# Patient Record
Sex: Male | Born: 1935
Health system: Southern US, Community
[De-identification: ages and names within clinical notes are randomized; demographics above are authoritative.]

## PROBLEM LIST (undated history)

## (undated) DIAGNOSIS — K219 Gastro-esophageal reflux disease without esophagitis: Secondary | ICD-10-CM

## (undated) DIAGNOSIS — I4891 Unspecified atrial fibrillation: Secondary | ICD-10-CM

## (undated) DIAGNOSIS — N400 Enlarged prostate without lower urinary tract symptoms: Secondary | ICD-10-CM

## (undated) DIAGNOSIS — I10 Essential (primary) hypertension: Secondary | ICD-10-CM

## (undated) DIAGNOSIS — I2510 Atherosclerotic heart disease of native coronary artery without angina pectoris: Secondary | ICD-10-CM

## (undated) DIAGNOSIS — E039 Hypothyroidism, unspecified: Secondary | ICD-10-CM

## (undated) DIAGNOSIS — R300 Dysuria: Secondary | ICD-10-CM

## (undated) DIAGNOSIS — M1990 Unspecified osteoarthritis, unspecified site: Secondary | ICD-10-CM

## (undated) DIAGNOSIS — E785 Hyperlipidemia, unspecified: Secondary | ICD-10-CM

## (undated) HISTORY — DX: Essential (primary) hypertension: I10

## (undated) HISTORY — PX: THYROID SURGERY: SHX805

## (undated) HISTORY — DX: Dysuria: R30.0

## (undated) HISTORY — PX: OTHER SURGICAL HISTORY: SHX169

## (undated) HISTORY — PX: TRANSURETHRAL RESECTION OF PROSTATE: SHX73

## (undated) HISTORY — DX: Unspecified atrial fibrillation: I48.91

## (undated) HISTORY — DX: Hyperlipidemia, unspecified: E78.5

## (undated) HISTORY — DX: Hypothyroidism, unspecified: E03.9

## (undated) HISTORY — DX: Atherosclerotic heart disease of native coronary artery without angina pectoris: I25.10

## (undated) HISTORY — DX: Benign prostatic hyperplasia without lower urinary tract symptoms: N40.0

---

## 2005-12-23 ENCOUNTER — Ambulatory Visit: Payer: Self-pay | Admitting: Unknown Physician Specialty

## 2005-12-30 ENCOUNTER — Ambulatory Visit: Payer: Self-pay | Admitting: Unknown Physician Specialty

## 2009-01-15 ENCOUNTER — Ambulatory Visit: Payer: Self-pay | Admitting: Ophthalmology

## 2009-01-23 ENCOUNTER — Ambulatory Visit: Payer: Self-pay | Admitting: Ophthalmology

## 2009-04-09 ENCOUNTER — Ambulatory Visit: Payer: Self-pay | Admitting: Ophthalmology

## 2013-03-22 ENCOUNTER — Emergency Department: Payer: Self-pay | Admitting: Internal Medicine

## 2013-03-22 LAB — CBC
HCT: 40.3 % (ref 40.0–52.0)
MCH: 32.5 pg (ref 26.0–34.0)
MCHC: 34.8 g/dL (ref 32.0–36.0)
Platelet: 198 10*3/uL (ref 150–440)
RBC: 4.32 10*6/uL — ABNORMAL LOW (ref 4.40–5.90)
WBC: 6.8 10*3/uL (ref 3.8–10.6)

## 2013-03-22 LAB — TROPONIN I: Troponin-I: <0.02 ng/mL

## 2013-03-22 LAB — COMPREHENSIVE METABOLIC PANEL
Albumin: 3.9 g/dL (ref 3.4–5.0)
Alkaline Phosphatase: 65 U/L (ref 50–136)
Anion Gap: 5 — ABNORMAL LOW (ref 7–16)
Calcium, Total: 9 mg/dL (ref 8.5–10.1)
Chloride: 106 mmol/L (ref 98–107)
Glucose: 111 mg/dL — ABNORMAL HIGH (ref 65–99)
Potassium: 3.8 mmol/L (ref 3.5–5.1)
SGOT(AST): 22 U/L (ref 15–37)
Sodium: 139 mmol/L (ref 136–145)
Total Protein: 6.9 g/dL (ref 6.4–8.2)

## 2013-03-22 LAB — URINALYSIS, COMPLETE
Bacteria: NONE SEEN
Glucose,UR: NEGATIVE mg/dL (ref 0–75)
Ketone: NEGATIVE
Nitrite: NEGATIVE
Ph: 7 (ref 4.5–8.0)
Protein: NEGATIVE
RBC,UR: 1 /HPF (ref 0–5)
Specific Gravity: 1.009 (ref 1.003–1.030)
Squamous Epithelial: NONE SEEN
WBC UR: <1 /HPF (ref 0–5)

## 2013-03-23 LAB — URINE CULTURE

## 2013-03-27 LAB — CULTURE, BLOOD (SINGLE)

## 2015-01-10 ENCOUNTER — Ambulatory Visit: Payer: Medicare Other

## 2015-01-10 ENCOUNTER — Other Ambulatory Visit: Payer: Self-pay

## 2015-01-11 ENCOUNTER — Encounter: Payer: Self-pay | Admitting: Urology

## 2015-01-11 ENCOUNTER — Ambulatory Visit (INDEPENDENT_AMBULATORY_CARE_PROVIDER_SITE_OTHER): Payer: Medicare Other | Admitting: Urology

## 2015-01-11 VITALS — BP 160/84 | HR 92 | Ht 69.0 in | Wt 143.9 lb

## 2015-01-11 DIAGNOSIS — N138 Other obstructive and reflux uropathy: Secondary | ICD-10-CM

## 2015-01-11 DIAGNOSIS — N401 Enlarged prostate with lower urinary tract symptoms: Secondary | ICD-10-CM

## 2015-01-11 DIAGNOSIS — R300 Dysuria: Secondary | ICD-10-CM

## 2015-01-11 LAB — BLADDER SCAN AMB NON-IMAGING: SCAN RESULT: 178

## 2015-01-11 MED ORDER — TAMSULOSIN HCL 0.4 MG PO CAPS: 0.4000 mg | ORAL_CAPSULE | Freq: Every day | ORAL | Status: DC

## 2015-01-11 MED ORDER — SILODOSIN 8 MG PO CAPS: 8.0000 mg | ORAL_CAPSULE | Freq: Every day | ORAL | Status: DC

## 2015-01-11 NOTE — Progress Notes (Signed)
01/11/2015 2:34 PM   Vincent Cash Sr. 1922/08/14 [id number]  Referring provider: No referring provider defined for this encounter.  Chief Complaint  Patient presents with  . Urinary Tract Infection    HPI: Mr. Fadeley is a 81 year old white male with a history of dysuria and BPH with LUTS managed with finasteride.  His IPSS score today is 12, which is moderate lower urinary tract symptomatology. He is mixed with his quality life due to his urinary symptoms. His PVR is 178 mL.    His major complaint today dysuria and frequency.  He has had these symptoms for two  years.  He denies any dysuria, hematuria or suprapubic pain.   He currently taking finasteride.  His has had a cystoscopy with Dr. Edwyna Shell on 05/23/2013 which noted trabeculation of the bladder.  Previous PSA's:    2.4 ng/mL on 01/24/2014    He also denies any recent fevers, chills, nausea or vomiting.      IPSS      01/11/15 1400       International Prostate Symptom Score   How often have you had the sensation of not emptying your bladder? Less than 1 in 5     How often have you had to urinate less than every two hours? More than half the time     How often have you found you stopped and started again several times when you urinated? Less than half the time     How often have you found it difficult to postpone urination? Less than 1 in 5 times     How often have you had a weak urinary stream? Not at All     How often have you had to strain to start urination? Less than 1 in 5 times     How many times did you typically get up at night to urinate? 3 Times     Total IPSS Score 12     Quality of Life due to urinary symptoms   If you were to spend the rest of your life with your urinary condition just the way it is now how would you feel about that? Mixed        Score:  1-7 Mild 8-19 Moderate 20-35 Severe     PMH: No past medical history on file.  Surgical History: No past surgical history on  file.  Home Medications:    Medication List       This list is accurate as of: 01/11/15  2:34 PM.  Always use your most recent med list.               aspirin EC 81 MG tablet  Take by mouth.     carvedilol 6.25 MG tablet  Commonly known as:  COREG  Take by mouth.     finasteride 5 MG tablet  Commonly known as:  PROSCAR  Take by mouth.     isosorbide mononitrate 60 MG 24 hr tablet  Commonly known as:  IMDUR  Take by mouth.     levothyroxine 150 MCG tablet  Commonly known as:  SYNTHROID, LEVOTHROID  Take by mouth.     naproxen 250 MG tablet  Commonly known as:  NAPROSYN  Take by mouth.     NIFEdipine 30 MG 24 hr tablet  Commonly known as:  PROCARDIA-XL/ADALAT-CC/NIFEDICAL-XL  Take by mouth.     omeprazole 20 MG capsule  Commonly known as:  PRILOSEC  Take by mouth.  polyethylene glycol packet  Commonly known as:  MIRALAX / GLYCOLAX  Take 17 g by mouth daily.     simvastatin 80 MG tablet  Commonly known as:  ZOCOR  Take by mouth.        Allergies: No Known Allergies  Family History: No family history on file.  Social History:  reports that he has quit smoking. He does not have any smokeless tobacco history on file. He reports that he does not drink alcohol or use illicit drugs.  ROS: UROLOGY Frequent Urination?: Yes Hard to postpone urination?: Yes Burning/pain with urination?: Yes Get up at night to urinate?: Yes Leakage of urine?: No Urine stream starts and stops?: No Trouble starting stream?: No Do you have to strain to urinate?: No Blood in urine?: No Urinary tract infection?: Yes Sexually transmitted disease?: No Injury to kidneys or bladder?: No Painful intercourse?: No Weak stream?: Yes Erection problems?: No Penile pain?: No  Gastrointestinal Nausea?: No Vomiting?: No Indigestion/heartburn?: No Diarrhea?: No Constipation?: Yes  Constitutional Fever: No Night sweats?: No Weight loss?: Yes Fatigue?: Yes  Skin Skin  rash/lesions?: No Itching?: No  Eyes Blurred vision?: No Double vision?: No  Ears/Nose/Throat Sore throat?: No Sinus problems?: No  Hematologic/Lymphatic Swollen glands?: No Easy bruising?: Yes  Cardiovascular Leg swelling?: No Chest pain?: No  Respiratory Cough?: No Shortness of breath?: No  Endocrine Excessive thirst?: No  Musculoskeletal Back pain?: Yes Joint pain?: Yes  Neurological Headaches?: No Dizziness?: Yes  Psychologic Depression?: Yes Anxiety?: Yes  Physical Exam: BP 160/84 mmHg  Pulse 92  Ht 5\' 9"  (1.753 m)  Wt 143 lb 14.4 oz (65.273 kg)  BMI 21.24 kg/m2  GU: Patient with normal phallus. Urethral meatus is patent.  No penile discharge. No penile lesions or rashes. Scrotum without lesions, cysts, rashes and/or edema.  Testicles are located scrotally bilaterally. No masses are appreciated in the testicles. Left and right epididymis are normal.  Rectal: Patient with  normal sphincter tone. Perineum without scarring or rashes. No rectal masses are appreciated. Prostate is approximately 60 grams, no nodules are appreciated. Seminal vesicles are normal.   Laboratory Data: Results for orders placed or performed in visit on 01/11/15  CULTURE, URINE COMPREHENSIVE  Result Value Ref Range   Result 1 Comment   Microscopic Examination  Result Value Ref Range   WBC, UA None seen 0 -  5 /hpf   RBC, UA None seen 0 -  2 /hpf   Epithelial Cells (non renal) None seen 0 - 10 /hpf   Renal Epithel, UA None seen None seen /hpf   Bacteria, UA None seen None seen/Few  Urinalysis, Complete  Result Value Ref Range   Specific Gravity, UA 1.010 1.005 - 1.030   pH, UA 7.0 5.0 - 7.5   Color, UA Yellow Yellow   Appearance Ur Clear Clear   Leukocytes, UA Negative Negative   Protein, UA Negative Negative/Trace   Glucose, UA Negative Negative   Ketones, UA Negative Negative   RBC, UA Trace (A) Negative   Bilirubin, UA Negative Negative   Urobilinogen, Ur 0.2 0.2 -  1.0 mg/dL   Nitrite, UA Negative Negative   Microscopic Examination See below:   BLADDER SCAN AMB NON-IMAGING  Result Value Ref Range   Scan Result 178    Lab Results  Component Value Date   WBC 6.8 03/22/2013   HGB 14.0 03/22/2013   HCT 40.3 03/22/2013   MCV 93 03/22/2013   PLT 198 03/22/2013    Lab  Results  Component Value Date   CREATININE 1.42* 03/22/2013    No results found for: PSA  No results found for: TESTOSTERONE  No results found for: HGBA1C  Urinalysis    Component Value Date/Time   COLORURINE Straw 03/22/2013 0717   APPEARANCEUR Clear 03/22/2013 0717   LABSPEC 1.009 03/22/2013 0717   PHURINE 7.0 03/22/2013 0717   GLUCOSEU Negative 03/22/2013 0717   HGBUR Negative 03/22/2013 0717   BILIRUBINUR Negative 03/22/2013 0717   KETONESUR Negative 03/22/2013 0717   PROTEINUR Negative 03/22/2013 0717   NITRITE Negative 03/22/2013 0717   LEUKOCYTESUR Negative 03/22/2013 0717    Pertinent Imaging:   Assessment & Plan:    1. BPH with LUTS:   Patient's IPSS score is 12/3.  His PVR 178 mL.  His DRE demonstrates enlargement.   I will restart his tamsulosin and he will continue the finasteride.  He is advised to take tamsulosin  30 minutes after his evening meal.  I advised him of the side effects, such as: retrograde ejaculation, sinus congestion, nasal congestion, rhinorrhea, rhinitis, dizziness, and seasonal allergic rhinitis.  He will follow up in two weeks for a  PVR and an IPSS.    - BLADDER SCAN AMB NON-IMAGING - Urinalysis, Complete  2. Dysuria:   UA is unremarkable today.  I will send his urine for culture for completeness.  If dysuria continues and UCx is negative, we should send his urine for cytology.     No Follow-up on file.  Michiel Cowboy, PA-C  [facility] [address], [address] [place], [place] [address] [phone]

## 2015-01-12 LAB — URINALYSIS, COMPLETE
Bilirubin, UA: NEGATIVE
Glucose, UA: NEGATIVE
KETONES UA: NEGATIVE
Leukocytes, UA: NEGATIVE
NITRITE UA: NEGATIVE
PROTEIN UA: NEGATIVE
SPEC GRAV UA: 1.01 (ref 1.005–1.030)
Urobilinogen, Ur: 0.2 mg/dL (ref 0.2–1.0)
pH, UA: 7 (ref 5.0–7.5)

## 2015-01-12 LAB — MICROSCOPIC EXAMINATION
BACTERIA UA: NONE SEEN
EPITHELIAL CELLS (NON RENAL): NONE SEEN /HPF (ref 0–10)
RBC, UA: NONE SEEN /hpf (ref 0–?)
Renal Epithel, UA: NONE SEEN /hpf
WBC, UA: NONE SEEN /hpf (ref 0–?)

## 2015-01-13 DIAGNOSIS — R300 Dysuria: Secondary | ICD-10-CM | POA: Insufficient documentation

## 2015-01-13 DIAGNOSIS — N138 Other obstructive and reflux uropathy: Secondary | ICD-10-CM | POA: Insufficient documentation

## 2015-01-13 DIAGNOSIS — N401 Enlarged prostate with lower urinary tract symptoms: Secondary | ICD-10-CM

## 2015-01-14 LAB — CULTURE, URINE COMPREHENSIVE

## 2015-01-29 ENCOUNTER — Ambulatory Visit: Payer: Medicare Other | Admitting: Urology

## 2015-02-05 ENCOUNTER — Encounter: Payer: Self-pay | Admitting: *Deleted

## 2015-02-13 ENCOUNTER — Ambulatory Visit (INDEPENDENT_AMBULATORY_CARE_PROVIDER_SITE_OTHER): Payer: Medicare Other | Admitting: Urology

## 2015-02-13 ENCOUNTER — Encounter: Payer: Self-pay | Admitting: Urology

## 2015-02-13 VITALS — BP 142/70 | HR 89 | Resp 18 | Ht 68.0 in | Wt 142.9 lb

## 2015-02-13 DIAGNOSIS — N401 Enlarged prostate with lower urinary tract symptoms: Secondary | ICD-10-CM | POA: Diagnosis not present

## 2015-02-13 DIAGNOSIS — R300 Dysuria: Secondary | ICD-10-CM | POA: Diagnosis not present

## 2015-02-13 DIAGNOSIS — N138 Other obstructive and reflux uropathy: Secondary | ICD-10-CM

## 2015-02-13 LAB — BLADDER SCAN AMB NON-IMAGING

## 2015-02-13 NOTE — Patient Instructions (Signed)
Patient may pick up Cystex PLUS, which is an over counter medication, after he completes the Uribel samples.

## 2015-02-13 NOTE — Progress Notes (Signed)
7:59 PM   Vincent LYON Sr. 11/01/1922 [id number]  Referring provider: Rafael Bihari, MD [address] [place], [place] [address]  Chief Complaint  Patient presents with  . Follow-up    2 week  . Dysuria  . Benign Prostatic Hypertrophy  . Urinary Tract Infection    HPI: Patient is a 81-year-old white male who presents today for 2 week follow-up after restarting his tamsulosin for his symptoms of dysuria and frequency.  Patient's urine was sent for culture and returned negative for infection.  He states his symptoms remain unchanged. He is still experiencing dysuria and frequency.  He is taking the tamsulosin and finasteride as prescribed.  His IPSS score today is 7, which is mild lower urinary tract symptomatology and an improvement from his previous score 14.  He is mixed with his quality life due to his urinary symptoms. His PVR is 20 mL which is also an improvement from 178 mL from his last visit.  His major complaint today are the continuation of the dysuria and frequency.  He has had these symptoms for two  years.  He denies  hematuria or suprapubic pain.     He also denies any recent fevers, chills, nausea or vomiting.      IPSS      01/11/15 1400 02/13/15 1400     International Prostate Symptom Score   How often have you had the sensation of not emptying your bladder? Less than 1 in 5 Not at All    How often have you had to urinate less than every two hours? More than half the time More than half the time    How often have you found you stopped and started again several times when you urinated? Less than half the time Not at All    How often have you found it difficult to postpone urination? Less than 1 in 5 times Not at All    How often have you had a weak urinary stream? Not at All Not at All    How often have you had to strain to start urination? Less than 1 in 5 times Not at All    How many times did you typically get up at night to urinate? 3 Times  3 Times    Total IPSS Score 12 7    Quality of Life due to urinary symptoms   If you were to spend the rest of your life with your urinary condition just the way it is now how would you feel about that? Mixed Mixed       Score:  1-7 Mild 8-19 Moderate 20-35 Severe     PMH: Past Medical History  Diagnosis Date  . Hypothyroid   . HLD (hyperlipidemia)   . HTN (hypertension)   . BPH (benign prostatic hyperplasia)   . Dysuria     Surgical History: Past Surgical History  Procedure Laterality Date  . Transurethral resection of prostate    . Collapsed lung      x 2  . Thyroid surgery      Home Medications:    Medication List       This list is accurate as of: 02/13/15  7:59 PM.  Always use your most recent med list.               aspirin EC 81 MG tablet  Take by mouth.     carvedilol 6.25 MG tablet  Commonly known as:  COREG  Take by mouth.     finasteride 5 MG tablet  Commonly known as:  PROSCAR  Take by mouth.     isosorbide mononitrate 60 MG 24 hr tablet  Commonly known as:  IMDUR  Take by mouth.     levothyroxine 150 MCG tablet  Commonly known as:  SYNTHROID, LEVOTHROID  Take by mouth.     naproxen 250 MG tablet  Commonly known as:  NAPROSYN  Take by mouth.     NIFEdipine 30 MG 24 hr tablet  Commonly known as:  PROCARDIA-XL/ADALAT-CC/NIFEDICAL-XL  Take by mouth.     omeprazole 20 MG capsule  Commonly known as:  PRILOSEC  Take by mouth.     polyethylene glycol packet  Commonly known as:  MIRALAX / GLYCOLAX  Take 17 g by mouth daily.     silodosin 8 MG Caps capsule  Commonly known as:  RAPAFLO  Take 1 capsule (8 mg total) by mouth daily with breakfast.     simvastatin 80 MG tablet  Commonly known as:  ZOCOR  Take by mouth.     tamsulosin 0.4 MG Caps capsule  Commonly known as:  FLOMAX  Take 1 capsule (0.4 mg total) by mouth daily.        Allergies: No Known Allergies  Family History: Family History  Problem Relation Age of  Onset  . Kidney disease Father   . Bladder Cancer Father     Social History:  reports that he has quit smoking. He does not have any smokeless tobacco history on file. He reports that he does not drink alcohol or use illicit drugs.  ROS: UROLOGY Frequent Urination?: No Hard to postpone urination?: No Burning/pain with urination?: Yes Get up at night to urinate?: Yes Leakage of urine?: No Urine stream starts and stops?: No Trouble starting stream?: No Do you have to strain to urinate?: No Blood in urine?: No Urinary tract infection?: No Sexually transmitted disease?: No Injury to kidneys or bladder?: No Painful intercourse?: No Weak stream?: No Erection problems?: No Penile pain?: No  Gastrointestinal Nausea?: No Vomiting?: No Indigestion/heartburn?: No Diarrhea?: No Constipation?: Yes  Constitutional Fever: No Night sweats?: No Weight loss?: No Fatigue?: No  Skin Skin rash/lesions?: No Itching?: No  Eyes Blurred vision?: No Double vision?: No  Ears/Nose/Throat Sore throat?: No Sinus problems?: Yes  Hematologic/Lymphatic Swollen glands?: No Easy bruising?: Yes  Cardiovascular Leg swelling?: No Chest pain?: No  Respiratory Cough?: No Shortness of breath?: No  Endocrine Excessive thirst?: No  Musculoskeletal Back pain?: Yes Joint pain?: No  Neurological Headaches?: No Dizziness?: Yes  Psychologic Depression?: Yes Anxiety?: No  Physical Exam: Blood pressure 142/70, pulse 89, resp. rate 18, height 5\' 8"  (1.727 m), weight 142 lb 14.4 oz (64.819 kg).  Pertinent Imaging: Results for orders placed or performed in visit on 02/13/15  BLADDER SCAN AMB NON-IMAGING  Result Value Ref Range   Scan Result 20ml     Assessment & Plan:    1. BPH with LUTS:   Patient's IPSS score is 7/3.  His PVR 0 mL.  His DRE demonstrates enlargement.   The restarting of the tamsulosin with the finasteride did improve the IPS S score and PVR, but he still is  experiencing dysuria and frequency.  - BLADDER SCAN AMB NON-IMAGING  2. Dysuria:   Patient is still experiencing dysuria and frequency. I will send his urine for cytology. I'll given him samples of Uribel advised him to purchase over-the-counter Cystex plus when he finished the [employer]  samples    Return for pending urine cytology results.  Michiel Cowboy, PA-C  [facility] [address], [address] [place], [place] [address] [phone]

## 2015-03-04 ENCOUNTER — Other Ambulatory Visit: Payer: Self-pay | Admitting: Urology

## 2015-03-05 ENCOUNTER — Telehealth: Payer: Self-pay

## 2015-03-05 NOTE — Telephone Encounter (Signed)
Spoke with pt in reference to cytology results. Pt stated hes not burning as much on urination but hes still have dysuria.

## 2015-03-05 NOTE — Telephone Encounter (Signed)
-----   Message from Shannon A McGowan, PA-C sent at 03/04/2015  8:34 PM EDT ----- Urine cytology was negative for malignant cells.  Is patient still experiencing dysuria? 

## 2015-03-05 NOTE — Telephone Encounter (Signed)
LMOM

## 2015-03-05 NOTE — Telephone Encounter (Signed)
-----   Message from Harle Battiest, PA-C sent at 03/04/2015  8:34 PM EDT ----- Urine cytology was negative for malignant cells.  Is patient still experiencing dysuria?

## 2015-03-18 ENCOUNTER — Emergency Department (HOSPITAL_COMMUNITY): Payer: Medicare Other

## 2015-03-18 ENCOUNTER — Observation Stay (HOSPITAL_COMMUNITY)
Admission: EM | Admit: 2015-03-18 | Discharge: 2015-03-19 | Disposition: A | Discharge status: Home / Self Care | Payer: Medicare Other | Attending: Internal Medicine | Admitting: Internal Medicine

## 2015-03-18 ENCOUNTER — Encounter (HOSPITAL_COMMUNITY): Payer: Self-pay | Admitting: General Practice

## 2015-03-18 ENCOUNTER — Inpatient Hospital Stay (HOSPITAL_COMMUNITY): Payer: Medicare Other

## 2015-03-18 DIAGNOSIS — I4891 Unspecified atrial fibrillation: Secondary | ICD-10-CM

## 2015-03-18 DIAGNOSIS — M1990 Unspecified osteoarthritis, unspecified site: Secondary | ICD-10-CM | POA: Diagnosis not present

## 2015-03-18 DIAGNOSIS — Z79899 Other long term (current) drug therapy: Secondary | ICD-10-CM | POA: Diagnosis not present

## 2015-03-18 DIAGNOSIS — Z23 Encounter for immunization: Secondary | ICD-10-CM | POA: Diagnosis not present

## 2015-03-18 DIAGNOSIS — I2510 Atherosclerotic heart disease of native coronary artery without angina pectoris: Secondary | ICD-10-CM | POA: Diagnosis present

## 2015-03-18 DIAGNOSIS — Z87891 Personal history of nicotine dependence: Secondary | ICD-10-CM | POA: Insufficient documentation

## 2015-03-18 DIAGNOSIS — K5909 Other constipation: Secondary | ICD-10-CM | POA: Diagnosis present

## 2015-03-18 DIAGNOSIS — I10 Essential (primary) hypertension: Secondary | ICD-10-CM | POA: Diagnosis not present

## 2015-03-18 DIAGNOSIS — I480 Paroxysmal atrial fibrillation: Secondary | ICD-10-CM | POA: Diagnosis present

## 2015-03-18 DIAGNOSIS — N400 Enlarged prostate without lower urinary tract symptoms: Secondary | ICD-10-CM | POA: Diagnosis present

## 2015-03-18 DIAGNOSIS — E538 Deficiency of other specified B group vitamins: Secondary | ICD-10-CM | POA: Diagnosis present

## 2015-03-18 DIAGNOSIS — J449 Chronic obstructive pulmonary disease, unspecified: Secondary | ICD-10-CM | POA: Diagnosis present

## 2015-03-18 DIAGNOSIS — N183 Chronic kidney disease, stage 3 unspecified: Secondary | ICD-10-CM | POA: Diagnosis present

## 2015-03-18 DIAGNOSIS — E785 Hyperlipidemia, unspecified: Secondary | ICD-10-CM | POA: Diagnosis present

## 2015-03-18 DIAGNOSIS — E039 Hypothyroidism, unspecified: Secondary | ICD-10-CM | POA: Diagnosis present

## 2015-03-18 DIAGNOSIS — R55 Syncope and collapse: Principal | ICD-10-CM | POA: Insufficient documentation

## 2015-03-18 DIAGNOSIS — N138 Other obstructive and reflux uropathy: Secondary | ICD-10-CM | POA: Diagnosis present

## 2015-03-18 DIAGNOSIS — I482 Chronic atrial fibrillation, unspecified: Secondary | ICD-10-CM | POA: Diagnosis present

## 2015-03-18 DIAGNOSIS — N401 Enlarged prostate with lower urinary tract symptoms: Secondary | ICD-10-CM

## 2015-03-18 DIAGNOSIS — Z7982 Long term (current) use of aspirin: Secondary | ICD-10-CM | POA: Insufficient documentation

## 2015-03-18 DIAGNOSIS — D649 Anemia, unspecified: Secondary | ICD-10-CM | POA: Diagnosis present

## 2015-03-18 DIAGNOSIS — Z87898 Personal history of other specified conditions: Secondary | ICD-10-CM | POA: Diagnosis present

## 2015-03-18 HISTORY — DX: Unspecified osteoarthritis, unspecified site: M19.90

## 2015-03-18 LAB — BASIC METABOLIC PANEL
Anion gap: 7 (ref 5–15)
BUN: 20 mg/dL (ref 6–20)
CHLORIDE: 107 mmol/L (ref 101–111)
CO2: 26 mmol/L (ref 22–32)
Calcium: 9.1 mg/dL (ref 8.9–10.3)
Creatinine, Ser: 1.32 mg/dL — ABNORMAL HIGH (ref 0.61–1.24)
GFR calc Af Amer: 53 mL/min — ABNORMAL LOW (ref >60)
GFR calc non Af Amer: 46 mL/min — ABNORMAL LOW (ref >60)
GLUCOSE: 124 mg/dL — AB (ref 65–99)
POTASSIUM: 4 mmol/L (ref 3.5–5.1)
Sodium: 140 mmol/L (ref 135–145)

## 2015-03-18 LAB — PROTIME-INR
INR: 1.09 (ref 0.00–1.49)
PROTHROMBIN TIME: 14.3 s (ref 11.6–15.2)

## 2015-03-18 LAB — CBC
HEMATOCRIT: 38.6 % — AB (ref 39.0–52.0)
Hemoglobin: 12.6 g/dL — ABNORMAL LOW (ref 13.0–17.0)
MCH: 30 pg (ref 26.0–34.0)
MCHC: 32.6 g/dL (ref 30.0–36.0)
MCV: 91.9 fL (ref 78.0–100.0)
Platelets: 263 10*3/uL (ref 150–400)
RBC: 4.2 MIL/uL — ABNORMAL LOW (ref 4.22–5.81)
RDW: 13.8 % (ref 11.5–15.5)
WBC: 6.8 10*3/uL (ref 4.0–10.5)

## 2015-03-18 LAB — HEPARIN LEVEL (UNFRACTIONATED): HEPARIN UNFRACTIONATED: 0.29 [IU]/mL — AB (ref 0.30–0.70)

## 2015-03-18 LAB — LIPID PANEL
CHOLESTEROL: 122 mg/dL (ref 0–200)
HDL: 45 mg/dL (ref >40)
LDL Cholesterol: 70 mg/dL (ref 0–99)
Total CHOL/HDL Ratio: 2.7 RATIO
Triglycerides: 34 mg/dL (ref <150)
VLDL: 7 mg/dL (ref 0–40)

## 2015-03-18 LAB — BRAIN NATRIURETIC PEPTIDE: B NATRIURETIC PEPTIDE 5: 179.3 pg/mL — AB (ref 0.0–100.0)

## 2015-03-18 LAB — HEPATIC FUNCTION PANEL
ALBUMIN: 3.4 g/dL — AB (ref 3.5–5.0)
ALT: 16 U/L — ABNORMAL LOW (ref 17–63)
AST: 26 U/L (ref 15–41)
Alkaline Phosphatase: 69 U/L (ref 38–126)
BILIRUBIN TOTAL: 0.5 mg/dL (ref 0.3–1.2)
Bilirubin, Direct: <0.1 mg/dL — ABNORMAL LOW (ref 0.1–0.5)
TOTAL PROTEIN: 7 g/dL (ref 6.5–8.1)

## 2015-03-18 LAB — IRON AND TIBC
IRON: 56 ug/dL (ref 45–182)
Saturation Ratios: 17 % — ABNORMAL LOW (ref 17.9–39.5)
TIBC: 326 ug/dL (ref 250–450)
UIBC: 270 ug/dL

## 2015-03-18 LAB — TSH: TSH: 1.758 u[IU]/mL (ref 0.350–4.500)

## 2015-03-18 LAB — URINALYSIS, ROUTINE W REFLEX MICROSCOPIC
BILIRUBIN URINE: NEGATIVE
Glucose, UA: NEGATIVE mg/dL
Hgb urine dipstick: NEGATIVE
Ketones, ur: 15 mg/dL — AB
Leukocytes, UA: NEGATIVE
NITRITE: NEGATIVE
Protein, ur: NEGATIVE mg/dL
SPECIFIC GRAVITY, URINE: 1.01 (ref 1.005–1.030)
UROBILINOGEN UA: 0.2 mg/dL (ref 0.0–1.0)
pH: 7 (ref 5.0–8.0)

## 2015-03-18 LAB — RETICULOCYTES
RBC.: 4.11 MIL/uL — ABNORMAL LOW (ref 4.22–5.81)
RETIC COUNT ABSOLUTE: 41.1 10*3/uL (ref 19.0–186.0)
Retic Ct Pct: 1 % (ref 0.4–3.1)

## 2015-03-18 LAB — FOLATE: FOLATE: 26.7 ng/mL (ref >5.9)

## 2015-03-18 LAB — TROPONIN I
Troponin I: <0.03 ng/mL (ref <0.031)
Troponin I: <0.03 ng/mL (ref <0.031)

## 2015-03-18 LAB — MAGNESIUM: MAGNESIUM: 2.2 mg/dL (ref 1.7–2.4)

## 2015-03-18 LAB — I-STAT TROPONIN, ED: Troponin i, poc: 0 ng/mL (ref 0.00–0.08)

## 2015-03-18 LAB — FERRITIN: Ferritin: 85 ng/mL (ref 24–336)

## 2015-03-18 LAB — VITAMIN B12: Vitamin B-12: 173 pg/mL — ABNORMAL LOW (ref 180–914)

## 2015-03-18 LAB — PHOSPHORUS: PHOSPHORUS: 3 mg/dL (ref 2.5–4.6)

## 2015-03-18 MED ORDER — ACETAMINOPHEN 650 MG RE SUPP: 650.0000 mg | Freq: Four times a day (QID) | RECTAL | Status: DC | PRN

## 2015-03-18 MED ORDER — MELATONIN 5 MG PO CAPS: 5.0000 mg | ORAL_CAPSULE | Freq: Every day | ORAL | Status: DC

## 2015-03-18 MED ORDER — VITAMIN B-12 1000 MCG PO TABS: 1000.0000 ug | ORAL_TABLET | Freq: Every day | ORAL | Status: DC

## 2015-03-18 MED ORDER — CARVEDILOL 6.25 MG PO TABS: 6.2500 mg | ORAL_TABLET | Freq: Two times a day (BID) | ORAL | Status: DC

## 2015-03-18 MED ORDER — ACETAMINOPHEN 325 MG PO TABS: 650.0000 mg | ORAL_TABLET | Freq: Four times a day (QID) | ORAL | Status: DC | PRN

## 2015-03-18 MED ORDER — SODIUM CHLORIDE 0.9 % IJ SOLN
3.0000 mL | Freq: Two times a day (BID) | INTRAMUSCULAR | Status: DC
  Administered 2015-03-18 – 2015-03-19 (×3): 3 mL via INTRAVENOUS

## 2015-03-18 MED ORDER — ONDANSETRON HCL 4 MG PO TABS: 4.0000 mg | ORAL_TABLET | Freq: Four times a day (QID) | ORAL | Status: DC | PRN

## 2015-03-18 MED ORDER — ONDANSETRON HCL 4 MG/2ML IJ SOLN: 4.0000 mg | Freq: Four times a day (QID) | INTRAMUSCULAR | Status: DC | PRN

## 2015-03-18 MED ORDER — LEVOTHYROXINE SODIUM 150 MCG PO TABS
150.0000 ug | ORAL_TABLET | Freq: Every day | ORAL | Status: DC
  Filled 2015-03-18: qty 1

## 2015-03-18 MED ORDER — CYANOCOBALAMIN 1000 MCG/ML IJ SOLN
1000.0000 ug | Freq: Once | INTRAMUSCULAR | Status: DC
  Filled 2015-03-18: qty 1

## 2015-03-18 MED ORDER — SENNOSIDES-DOCUSATE SODIUM 8.6-50 MG PO TABS
1.0000 | ORAL_TABLET | Freq: Every evening | ORAL | Status: DC | PRN
  Filled 2015-03-18: qty 1

## 2015-03-18 MED ORDER — INFLUENZA VAC SPLIT QUAD 0.5 ML IM SUSY
0.5000 mL | PREFILLED_SYRINGE | INTRAMUSCULAR | Status: AC
  Administered 2015-03-19: 0.5 mL via INTRAMUSCULAR
  Filled 2015-03-18: qty 0.5

## 2015-03-18 MED ORDER — HEPARIN (PORCINE) IN NACL 100-0.45 UNIT/ML-% IJ SOLN
950.0000 [IU]/h | INTRAMUSCULAR | Status: DC
  Administered 2015-03-18: 900 [IU]/h via INTRAVENOUS
  Administered 2015-03-19: 950 [IU]/h via INTRAVENOUS
  Filled 2015-03-18 (×2): qty 250

## 2015-03-18 MED ORDER — HEPARIN BOLUS VIA INFUSION
3500.0000 [IU] | Freq: Once | INTRAVENOUS | Status: AC
Start: 2015-03-18 — End: 2015-03-18
  Administered 2015-03-18: 3500 [IU] via INTRAVENOUS
  Filled 2015-03-18: qty 3500

## 2015-03-18 MED ORDER — ENSURE ENLIVE PO LIQD
237.0000 mL | Freq: Two times a day (BID) | ORAL | Status: DC
  Administered 2015-03-18 – 2015-03-19 (×2): 237 mL via ORAL

## 2015-03-18 MED ORDER — PANTOPRAZOLE SODIUM 40 MG PO TBEC
40.0000 mg | DELAYED_RELEASE_TABLET | Freq: Every day | ORAL | Status: DC
  Administered 2015-03-18 – 2015-03-19 (×2): 40 mg via ORAL
  Filled 2015-03-18 (×2): qty 1

## 2015-03-18 MED ORDER — CARVEDILOL 6.25 MG PO TABS
6.2500 mg | ORAL_TABLET | Freq: Two times a day (BID) | ORAL | Status: DC
  Administered 2015-03-18 – 2015-03-19 (×2): 6.25 mg via ORAL
  Filled 2015-03-18 (×2): qty 1

## 2015-03-18 MED ORDER — VITAMIN B-12 1000 MCG PO TABS
1000.0000 ug | ORAL_TABLET | Freq: Every day | ORAL | Status: DC
  Administered 2015-03-18: 1000 ug via ORAL
  Filled 2015-03-18: qty 1

## 2015-03-18 MED ORDER — ATORVASTATIN CALCIUM 40 MG PO TABS
40.0000 mg | ORAL_TABLET | Freq: Every day | ORAL | Status: DC
  Administered 2015-03-18: 40 mg via ORAL
  Filled 2015-03-18: qty 1

## 2015-03-18 MED ORDER — CYANOCOBALAMIN 1000 MCG/ML IJ SOLN: 1000.0000 ug | Freq: Once | INTRAMUSCULAR | Status: DC

## 2015-03-18 MED ORDER — CYANOCOBALAMIN 1000 MCG/ML IJ SOLN
1000.0000 ug | Freq: Once | INTRAMUSCULAR | Status: AC
  Administered 2015-03-19: 1000 ug via INTRAMUSCULAR
  Filled 2015-03-18: qty 1

## 2015-03-18 NOTE — Progress Notes (Signed)
  Echocardiogram 2D Echocardiogram has been performed.  Tye Savoy 03/18/2015, 3:31 PM

## 2015-03-18 NOTE — Progress Notes (Signed)
ANTICOAGULATION CONSULT NOTE - Follow Up Consult  Pharmacy Consult for heparin Indication: atrial fibrillation  No Known Allergies  Patient Measurements: Height:  (175.3 cm) Weight: 142 lb (64.411 kg) IBW/kg (Calculated) : 70.7 Heparin Dosing Weight: 64.4kg  Vital Signs: Temp: 98.4 F (36.9 C) (10/03 1729) Temp Source: Oral (10/03 1729) BP: 123/44 mmHg (10/03 1729) Pulse Rate: 84 (10/03 1729)  Labs:  Recent Labs  03/18/15 0913 03/18/15 1600 03/18/15 1900  HGB 12.6*  --   --   HCT 38.6*  --   --   PLT 263  --   --   LABPROT 14.3  --   --   INR 1.09  --   --   HEPARINUNFRC  --   --  0.29*  CREATININE 1.32*  --   --   TROPONINI  --  <0.03  --     Estimated Creatinine Clearance: 33.9 mL/min (by C-G formula based on Cr of 1.32).   Medical History: Past Medical History  Diagnosis Date  . Hypothyroid   . HLD (hyperlipidemia)   . HTN (hypertension)   . BPH (benign prostatic hyperplasia)   . Dysuria   . Arthritis     Medications:  Infusions:  . heparin 900 Units/hr (03/18/15 1129)    Assessment: 89 yom presented to the ED with syncope. Currently on IV heparin @ 900 units/hr for afib. H/H 12.6/38.6. Plt wnl. RN to check for any s/s of bleeding and call back if any are observed.   Goal of Therapy:  Heparin level 0.3-0.7 units/ml Monitor platelets by anticoagulation protocol: Yes   Plan:  - Increase heparin gtt to 950 units/hr if no bleeding  - Check an 8 hour HL - Daily HL and CBC  Vinnie Level, PharmD., BCPS Clinical Pharmacist Pager [phone]

## 2015-03-18 NOTE — ED Provider Notes (Signed)
CSN: [id number]     Arrival date & time 03/18/15  4098 History   First MD Initiated Contact with Patient 03/18/15 [phone]     Chief Complaint  Patient presents with  . Loss of Consciousness     (Consider location/radiation/quality/duration/timing/severity/associated sxs/prior Treatment) HPI Comments: Patient with history of coronary artery disease (treated medically), hypertension, hyperlipidemia, hypothyroidism -- presents after having a syncopal episode. Patient had a near syncopal episode last night at approximately 10:30 PM after urinating. Patient describes falling slowly to the ground but was awake. He struck his left shoulder on a shower door. He was able to get to his cell phone to call family. Patient was brought to his family's home last night where he could be watched. Upon awaking this morning, patient became very lightheaded after using the bathroom. Patient's family member saw him leaning over and responded to him. Patient was unresponsive for approximately 1 minute. He did not fall or hit his head. EMS was called for transport to the hospital. Patient is now back to his baseline. He denies any chest pain or shortness of breath at any time. He is in A. fib but has no history of the same. No history of congestive heart failure.  The history is provided by the patient and medical records.    Past Medical History  Diagnosis Date  . Hypothyroid   . HLD (hyperlipidemia)   . HTN (hypertension)   . BPH (benign prostatic hyperplasia)   . Dysuria   . Arthritis    Past Surgical History  Procedure Laterality Date  . Transurethral resection of prostate    . Collapsed lung      x 2  . Thyroid surgery     Family History  Problem Relation Age of Onset  . Kidney disease Father   . Bladder Cancer Father    Social History  Substance Use Topics  . Smoking status: Former Games developer  . Smokeless tobacco: None  . Alcohol Use: No    Review of Systems  Constitutional: Negative for fever.   HENT: Negative for rhinorrhea and sore throat.   Eyes: Negative for redness.  Respiratory: Negative for cough and shortness of breath.   Cardiovascular: Negative for chest pain and leg swelling.  Gastrointestinal: Negative for nausea, vomiting, abdominal pain and diarrhea.  Genitourinary: Negative for dysuria.  Musculoskeletal: Negative for myalgias.  Skin: Negative for rash.  Neurological: Positive for syncope and light-headedness. Negative for weakness and headaches.      Allergies  Review of patient's allergies indicates no known allergies.  Home Medications   Prior to Admission medications   Medication Sig Start Date End Date Taking? Authorizing Provider  aspirin EC 81 MG tablet Take by mouth.    Historical Provider, MD  carvedilol (COREG) 6.25 MG tablet Take by mouth. 11/05/14 11/05/15  Historical Provider, MD  finasteride (PROSCAR) 5 MG tablet Take by mouth.    Historical Provider, MD  isosorbide mononitrate (IMDUR) 60 MG 24 hr tablet Take by mouth. 10/15/14   Historical Provider, MD  levothyroxine (SYNTHROID, LEVOTHROID) 150 MCG tablet Take by mouth. 01/07/15   Historical Provider, MD  naproxen (NAPROSYN) 250 MG tablet Take by mouth.    Historical Provider, MD  NIFEdipine (PROCARDIA-XL/ADALAT-CC/NIFEDICAL-XL) 30 MG 24 hr tablet Take by mouth. 12/05/14   Historical Provider, MD  omeprazole (PRILOSEC) 20 MG capsule Take by mouth. 08/20/14   Historical Provider, MD  polyethylene glycol (MIRALAX / GLYCOLAX) packet Take 17 g by mouth daily.    Historical Provider,  MD  silodosin (RAPAFLO) 8 MG CAPS capsule Take 1 capsule (8 mg total) by mouth daily with breakfast. Patient not taking: Reported on 02/13/2015 01/11/15   Carollee Herter A McGowan, PA-C  simvastatin (ZOCOR) 80 MG tablet Take by mouth. 01/22/14   Historical Provider, MD  tamsulosin (FLOMAX) 0.4 MG CAPS capsule Take 1 capsule (0.4 mg total) by mouth daily. 01/11/15   Carollee Herter A McGowan, PA-C   BP 149/90 mmHg  Pulse 123  Temp(Src) 98 F  (36.7 C) (Oral)  Resp 31  Ht  (1.753 m)  Wt 142 lb (64.411 kg)  BMI 20.96 kg/m2  SpO2 100%   Physical Exam  Constitutional: He appears well-developed and well-nourished.  HENT:  Head: Normocephalic and atraumatic.  Mouth/Throat: Oropharynx is clear and moist.  Eyes: Conjunctivae are normal. Right eye exhibits no discharge. Left eye exhibits no discharge.  Neck: Normal range of motion. Neck supple.  Cardiovascular: Normal rate and normal heart sounds.  An irregular rhythm present.  No murmur heard. HR 90  Pulmonary/Chest: Effort normal and breath sounds normal. No respiratory distress. He has no wheezes. He has no rales.  Abdominal: Soft. Bowel sounds are normal. There is no tenderness. There is no rebound and no guarding.  Neurological: He is alert.  Skin: Skin is warm and dry.  Psychiatric: He has a normal mood and affect.  Nursing note and vitals reviewed.   ED Course  Procedures (including critical care time) Labs Review Labs Reviewed  BASIC METABOLIC PANEL - Abnormal; Notable for the following:    Glucose, Bld 124 (*)    Creatinine, Ser 1.32 (*)    GFR calc non Af Amer 46 (*)    GFR calc Af Amer 53 (*)    All other components within normal limits  CBC - Abnormal; Notable for the following:    RBC 4.20 (*)    Hemoglobin 12.6 (*)    HCT 38.6 (*)    All other components within normal limits  URINALYSIS, ROUTINE W REFLEX MICROSCOPIC (NOT AT [facility]) - Abnormal; Notable for the following:    Ketones, ur 15 (*)    All other components within normal limits  PROTIME-INR  I-STAT TROPOININ, ED    Imaging Review Dg Chest 2 View  03/18/2015   CLINICAL DATA:  Syncopal episode last night, resulting in a fall.  EXAM: CHEST  2 VIEW  COMPARISON:  None.  FINDINGS: Normal sized heart. Clear lungs. The lungs are mildly hyperexpanded. Small amount of linear density in the lingula. Thoracic spine degenerative changes. No fracture or pneumothorax seen.  IMPRESSION: 1. Mild  atelectasis or scarring in the lingula. 2. COPD.   Electronically Signed   By: Beckie Salts M.D.   On: 03/18/2015 10:07   I have personally reviewed and evaluated these images and lab results as part of my medical decision-making.   EKG Interpretation   Date/Time:  Monday March 18 2015 08:47:20 EDT Ventricular Rate:  79 PR Interval:    QRS Duration: 73 QT Interval:  354 QTC Calculation: 406 R Axis:   62 Text Interpretation:  Atrial fibrillation Probable anteroseptal infarct,  old Atrial fibrillation Artifact Abnormal ekg Confirmed by Gerhard Munch  MD [phone]) on 03/18/2015 8:58:21 AM      9:13 AM Patient seen and examined. Work-up initiated.    Vital signs reviewed and are as follows: BP 149/90 mmHg  Pulse 123  Temp(Src) 98 F (36.7 C) (Oral)  Resp 31  Ht  (1.753 m)  Wt  142 lb (64.411 kg)  BMI 20.96 kg/m2  SpO2 100%   11:03 AM Patient seen by Dr. Jeraldine Loots. IMTS admit.    MDM   Final diagnoses:  Syncope, unspecified syncope type  Atrial fibrillation, unspecified type (HCC)   Admit.     Renne Crigler, PA-C 03/18/15 1104  Gerhard Munch, MD 03/18/15 [phone]

## 2015-03-18 NOTE — Progress Notes (Signed)
Called ED for report.  RN not available at this time.

## 2015-03-18 NOTE — Consult Note (Signed)
Cardiologist: New Reason for Consult: Syncope Referring Physician:   [name] is an 81 y.o. male.  HPI:   Patient is a very pleasant 81 year old male, whose wife passed away 2 weeks ago, with a history of coronary artery disease, hypothyroidism, hyperlipidemia, hypertension, BPH and arthritis.  Patient had a left heart catheterization 15-20 years ago.  He had a lesion posteriorly that could not be engaged.  He's had no issues with this since and has not seen a cardiologist since that time. This was done at [facility].  He presents with atrial fibrillation and syncopal episode.  Patient reports that approximately 2200hrs last night he got up from his chair went into the bathroom, urinated, suddenly felt dizzy and fell into the shower.  He denies hitting his head.  He had a similar episode at 0600 hrs. this morning when he went to the bathroom, urinated suddenly became dizzy and felt nauseated,sweaty and chilled. His son was holding onto him when he slumped over. He was unconscious for just a few seconds. During the day on Saturday he was extremely active using his push mower to mow the grass and had no difficulties. He denies chest pain or shortness of breath.  The patient currently denies orthopnea, PND, cough, congestion, abdominal pain, hematochezia, melena, lower extremity edema, claudication.   Past Medical History  Diagnosis Date  . Hypothyroid   . HLD (hyperlipidemia)   . HTN (hypertension)   . BPH (benign prostatic hyperplasia)   . Dysuria   . Arthritis     Past Surgical History  Procedure Laterality Date  . Transurethral resection of prostate    . Collapsed lung      x 2  . Thyroid surgery      Family History  Problem Relation Age of Onset  . Kidney disease Father   . Bladder Cancer Father     Social History:  reports that he has quit smoking. He does not have any smokeless tobacco history on file. He reports that he does not drink alcohol or use  illicit drugs.  Allergies: No Known Allergies  Medications:  Medication Sig  aspirin EC 81 MG tablet Take by mouth.  carvedilol (COREG) 6.25 MG tablet Take 6.25 mg by mouth 2 (two) times daily with a meal.   finasteride (PROSCAR) 5 MG tablet Take by mouth.  isosorbide mononitrate (IMDUR) 60 MG 24 hr tablet Take by mouth.  levothyroxine (SYNTHROID, LEVOTHROID) 150 MCG tablet Take by mouth.  Melatonin 5 MG CAPS Take 5 mg by mouth at bedtime.  naproxen (NAPROSYN) 250 MG tablet Take by mouth.  NIFEdipine (PROCARDIA-XL/ADALAT-CC/NIFEDICAL-XL) 30 MG 24 hr tablet Take by mouth.  omeprazole (PRILOSEC) 20 MG capsule Take by mouth.  polyethylene glycol (MIRALAX / GLYCOLAX) packet Take 17 g by mouth daily.  simvastatin (ZOCOR) 80 MG tablet Take by mouth.  tamsulosin (FLOMAX) 0.4 MG CAPS capsule Take 1 capsule (0.4 mg total) by mouth daily.     Results for orders placed or performed during the hospital encounter of 03/18/15 (from the past 48 hour(s))  Basic metabolic panel     Status: Abnormal   Collection Time: 03/18/15  9:13 AM  Result Value Ref Range   Sodium 140 135 - 145 mmol/L   Potassium 4.0 3.5 - 5.1 mmol/L   Chloride 107 101 - 111 mmol/L   CO2 26 22 - 32 mmol/L   Glucose, Bld 124 (H) 65 - 99 mg/dL   BUN 20 6 - 20 mg/dL  Creatinine, Ser 1.32 (H) 0.61 - 1.24 mg/dL   Calcium 9.1 8.9 - 10.3 mg/dL   GFR calc non Af Amer 46 (L) >60 mL/min   GFR calc Af Amer 53 (L) >60 mL/min    Comment: (NOTE) The eGFR has been calculated using the CKD EPI equation. This calculation has not been validated in all clinical situations. eGFR's persistently <60 mL/min signify possible Chronic Kidney Disease.    Anion gap 7 5 - 15  CBC     Status: Abnormal   Collection Time: 03/18/15  9:13 AM  Result Value Ref Range   WBC 6.8 4.0 - 10.5 K/uL   RBC 4.20 (L) 4.22 - 5.81 MIL/uL   Hemoglobin 12.6 (L) 13.0 - 17.0 g/dL   HCT 38.6 (L) 39.0 - 52.0 %   MCV 91.9 78.0 - 100.0 fL   MCH 30.0 26.0 - 34.0 pg    MCHC 32.6 30.0 - 36.0 g/dL   RDW 13.8 11.5 - 15.5 %   Platelets 263 150 - 400 K/uL  Protime-INR     Status: None   Collection Time: 03/18/15  9:13 AM  Result Value Ref Range   Prothrombin Time 14.3 11.6 - 15.2 seconds   INR 1.09 0.00 - 1.49  I-stat troponin, ED     Status: None   Collection Time: 03/18/15  9:37 AM  Result Value Ref Range   Troponin i, poc 0.00 0.00 - 0.08 ng/mL   Comment 3            Comment: Due to the release kinetics of cTnI, a negative result within the first hours of the onset of symptoms does not rule out myocardial infarction with certainty. If myocardial infarction is still suspected, repeat the test at appropriate intervals.   Urinalysis, Routine w reflex microscopic (not at [facility])     Status: Abnormal   Collection Time: 03/18/15  9:45 AM  Result Value Ref Range   Color, Urine YELLOW YELLOW   APPearance CLEAR CLEAR   Specific Gravity, Urine 1.010 1.005 - 1.030   pH 7.0 5.0 - 8.0   Glucose, UA NEGATIVE NEGATIVE mg/dL   Hgb urine dipstick NEGATIVE NEGATIVE   Bilirubin Urine NEGATIVE NEGATIVE   Ketones, ur 15 (A) NEGATIVE mg/dL   Protein, ur NEGATIVE NEGATIVE mg/dL   Urobilinogen, UA 0.2 0.0 - 1.0 mg/dL   Nitrite NEGATIVE NEGATIVE   Leukocytes, UA NEGATIVE NEGATIVE    Comment: MICROSCOPIC NOT DONE ON URINES WITH NEGATIVE PROTEIN, BLOOD, LEUKOCYTES, NITRITE, OR GLUCOSE <1000 mg/dL.    Dg Chest 2 View  03/18/2015   CLINICAL DATA:  Syncopal episode last night, resulting in a fall.  EXAM: CHEST  2 VIEW  COMPARISON:  None.  FINDINGS: Normal sized heart. Clear lungs. The lungs are mildly hyperexpanded. Small amount of linear density in the lingula. Thoracic spine degenerative changes. No fracture or pneumothorax seen.  IMPRESSION: 1. Mild atelectasis or scarring in the lingula. 2. COPD.   Electronically Signed   By: Claudie Revering M.D.   On: 03/18/2015 10:07    Review of Systems  Constitutional: Positive for chills and diaphoresis. Negative for fever.  HENT:  Negative for congestion and sore throat.   Respiratory: Negative for cough and shortness of breath.   Cardiovascular: Negative for chest pain, palpitations, orthopnea, claudication, leg swelling and PND.  Gastrointestinal: Positive for nausea. Negative for vomiting, abdominal pain, blood in stool and melena.  Genitourinary: Negative for hematuria.  Musculoskeletal: Negative for myalgias.  Neurological: Positive for dizziness.  All other systems reviewed and are negative.  Blood pressure 149/65, pulse 43, temperature 98 F (36.7 C), temperature source Oral, resp. rate 13, height _0  (1.753 m), weight 64.411 kg (142 lb), SpO2 99 %. Physical Exam  Nursing note and vitals reviewed. Constitutional: He is oriented to person, place, and time. He appears well-developed and well-nourished. No distress.  HENT:  Head: Normocephalic and atraumatic.  Eyes: EOM are normal. Pupils are equal, round, and reactive to light. No scleral icterus.  Neck: Normal range of motion. Neck supple. No JVD present.  Cardiovascular: Normal rate, S1 normal and S2 normal.  A regularly irregular rhythm present.  No murmur heard. Pulses:      Radial pulses are 2+ on the right side, and 2+ on the left side.       Dorsalis pedis pulses are 2+ on the right side, and 2+ on the left side.  No carotid bruit  Respiratory: Effort normal and breath sounds normal. He has no wheezes. He has no rales.  GI: Soft. Bowel sounds are normal. He exhibits no distension. There is no tenderness.  Musculoskeletal: He exhibits no edema.  Lymphadenopathy:    He has no cervical adenopathy.  Neurological: He is alert and oriented to person, place, and time. He exhibits normal muscle tone.  Skin: Skin is warm and dry.  Psychiatric: He has a normal mood and affect.    Assessment/Plan: Active Problems:   Syncope   Paroxysmal atrial fibrillation (HCC)   HTN   HLD   CAD  While examining the patient, he converted from a controlled rate  A-Fib to normal sinus rhythm with frequent PACs.  He is currently on heparin. CHADSVASC 4.  Given his 2 episodes of syncope I do not think he is a candidate for anticoagulation at this time.  It sounds as though his syncopal events are micturition related.  Continue to monitor cardiac rhythm.  Recommend OP HR monitor.  2-D echocardiogram ordered.  He is extremely active and has no complaints of angina.   Tarri Fuller, [place] 03/18/2015, 12:40 PM   As above, patient seen and examined. Briefly he is a 81 year old male with a past medical history of hypertension, hyperlipidemia, hypothyroidism and possible coronary artery disease for evaluation of syncope. Patient is very active and denies dyspnea on exertion, orthopnea, PND, pedal edema, palpitations or exertional chest pain. He had a cardiac catheterization years ago in [place] and medical therapy recommended by his report. Last night as he was urinating he developed dizziness. He fell against the shower. There was no preceding dyspnea, chest pain, palpitations. He had another episode of dizziness while urinating this morning. He then developed nausea and diaphoresis followed by frank syncope. He was unconscious for several seconds per his daughter-in-law. He now feels well. His initial electrocardiogram showed atrial fibrillation and prior septal infarct could not be excluded. He is now in sinus rhythm.  Etiology of syncope unclear but micturition syncope seems likely. Plan to follow on telemetry. Check echocardiogram for LV function. If normal patient could be discharged tomorrow morning with outpatient event monitor.  Note he was in atrial fibrillation on admission but has converted to sinus rhythm. Check echocardiogram and TSH. Continue coreg. Embolic risk factors include hypertension, coronary artery disease and age greater than 77 CHADSvasc 4. He would benefit from anticoagulation long-term. However given 2 recent syncopal episodes I'm hesitant to  anticoagulate until we make sure there is no recurrence. He understands the high risk of embolic event off of  anticoagulation but I think this is appropriate for now. Note I also explained that he should not drive for 6 months. Vincent Mendez

## 2015-03-18 NOTE — H&P (Signed)
Date: 03/18/2015               Patient Name:  Vincent HAGEDORN Sr. MRN: [id number]  DOB: 1922-07-30 Age / Sex: 81 y.o., male   PCP: Rafael Bihari, MD         Medical Service: Internal Medicine Teaching Service         Attending Physician: Dr. Cliffton Asters     First Contact: Dr. Allena Katz   Pager: [phone]  Second Contact: Dr. Delane Ginger  Pager: [phone]       After Hours (After 5p/  First Contact Pager: [phone]  weekends / holidays): Second Contact Pager: [phone]   Chief Complaint: dizziness   History of Present Illness:   Roque Cash is a very pleasant 81 year old man with past medical history of hypertension, hyperlipidemia, CAD, BPH s/p TURP, CKD Stage 3, and OA who presents with chief complaint of dizziness.   He is accompanied by his son and daughter-in-law.  He reports feeling his normal self until last night when he felt lightheaded after urinating and consequently fell on the toliet after hitting his right shoulder on the shower door. He called his daughter-in-law who then brought him to their house to stay the night. This morning around 6 AM he again felt lightheaded after urinating in the bathroom in addition to feeling diaphoretic, weak, and nauseous. His daughter-in-law then found him slumped over on his knees after which he was unresponsiveness for a few seconds. He denies head trauma, chest pain, dyspnea, orthopnea, PND, LE edema, paraesthesias, focal weakness, slurred speech, confusion, tongue biting, bowel/bladder incontinence, seizure activity, or abdominal pain. He reports having lightheadedness with standing about 2.5 years ago that resolved after adjustment in his medications. He denies history of atrial fibrillation or CHF. He has history of CAD and had heart catherization many years ago (20 yrs)  that was managed with medical therapy. He has history of hypothyroidism which he is compliant with replacement therapy. He has history of BPH for which he is on  finasteride and flomax for. He reports mildly reduced to good PO intake.   He currently reports being back to his baseline. Of note his wife of many years passed away 1 week ago.  Meds: No current facility-administered medications for this encounter.   Current Outpatient Prescriptions  Medication Sig Dispense Refill  . aspirin EC 81 MG tablet Take by mouth.    . carvedilol (COREG) 6.25 MG tablet Take by mouth.    . finasteride (PROSCAR) 5 MG tablet Take by mouth.    . isosorbide mononitrate (IMDUR) 60 MG 24 hr tablet Take by mouth.    . levothyroxine (SYNTHROID, LEVOTHROID) 150 MCG tablet Take by mouth.    . naproxen (NAPROSYN) 250 MG tablet Take by mouth.    Marland Kitchen NIFEdipine (PROCARDIA-XL/ADALAT-CC/NIFEDICAL-XL) 30 MG 24 hr tablet Take by mouth.    Marland Kitchen omeprazole (PRILOSEC) 20 MG capsule Take by mouth.    . polyethylene glycol (MIRALAX / GLYCOLAX) packet Take 17 g by mouth daily.    . silodosin (RAPAFLO) 8 MG CAPS capsule Take 1 capsule (8 mg total) by mouth daily with breakfast. (Patient not taking: Reported on 02/13/2015) 30 capsule 12  . simvastatin (ZOCOR) 80 MG tablet Take by mouth.    . tamsulosin (FLOMAX) 0.4 MG CAPS capsule Take 1 capsule (0.4 mg total) by mouth daily. 30 capsule 12    Allergies: Allergies as of 03/18/2015  . (No Known Allergies)   Past Medical History  Diagnosis Date  . Hypothyroid   . HLD (hyperlipidemia)   . HTN (hypertension)   . BPH (benign prostatic hyperplasia)   . Dysuria   . Arthritis    Past Surgical History  Procedure Laterality Date  . Transurethral resection of prostate    . Collapsed lung      x 2  . Thyroid surgery     Family History  Problem Relation Age of Onset  . Kidney disease Father   . Bladder Cancer Father    Social History   Social History  . Marital Status: Married    Spouse Name: N/A  . Number of Children: N/A  . Years of Education: N/A   Occupational History  . Not on file.   Social History Main Topics  .  Smoking status: Former Games developer  . Smokeless tobacco: Not on file  . Alcohol Use: No  . Drug Use: No  . Sexual Activity: Not on file   Other Topics Concern  . Not on file   Social History Narrative    Review of Systems: Pertinent items are noted in HPI. Plus chronic constipation, chronic nocturia, and right pedal edema  Physical Exam: Blood pressure 149/65, pulse 43, temperature 98 F (36.7 C), temperature source Oral, resp. rate 13, height  (1.753 m), weight 142 lb (64.411 kg), SpO2 99 %.  General: Appears younger than stated ago, NAD, sitting up comfortably in bed HEENT: Normocephalic and atraumatic. Oropharynx is clear and moist  Heart: Irregularly irregular rhythm with normal rate. No murmur. Lungs: Clear to auscultation bilaterally with no ronchi, wheezing, or rales Abdomen: Soft, non-tender, non-distended, normal BS  Extremities: Trace right LE edema Skin: Warm and dry. Senile purpura present.   Neuro: A & O x 3. No focal deficits.     Lab results: Basic Metabolic Panel:  Recent Labs  91/47/82 0913  NA 140  K 4.0  CL 107  CO2 26  GLUCOSE 124*  BUN 20  CREATININE 1.32*  CALCIUM 9.1    CBC:  Recent Labs  03/18/15 0913  WBC 6.8  HGB 12.6*  HCT 38.6*  MCV 91.9  PLT 263   Coagulation:  Recent Labs  03/18/15 0913  LABPROT 14.3  INR 1.09   Urinalysis:  Recent Labs  03/18/15 0945  COLORURINE YELLOW  LABSPEC 1.010  PHURINE 7.0  GLUCOSEU NEGATIVE  HGBUR NEGATIVE  BILIRUBINUR NEGATIVE  KETONESUR 15*  PROTEINUR NEGATIVE  UROBILINOGEN 0.2  NITRITE NEGATIVE  LEUKOCYTESUR NEGATIVE     Imaging results:  Dg Chest 2 View  03/18/2015   CLINICAL DATA:  Syncopal episode last night, resulting in a fall.  EXAM: CHEST  2 VIEW  COMPARISON:  None.  FINDINGS: Normal sized heart. Clear lungs. The lungs are mildly hyperexpanded. Small amount of linear density in the lingula. Thoracic spine degenerative changes. No fracture or pneumothorax seen.   IMPRESSION: 1. Mild atelectasis or scarring in the lingula. 2. COPD.   Electronically Signed   By: Beckie Salts M.D.   On: 03/18/2015 10:07    Other results:  Date/Time: Monday March 18 2015 08:47:20 EDT Ventricular Rate: 79 PR Interval:  QRS Duration: 73 QT Interval: 354 QTC Calculation: 406 R Axis: 62 Text Interpretation: Atrial fibrillation Probable anteroseptal infarct, old Atrial fibrillation     Assessment & Plan by Problem:  Syncope - Pt presents with 2 episodes of syncope after urination most likely situational in nature. Pt reports having prior history about 2 years ago of lightheadedness after urination.  Due to new-onset atrial fibrillation concern for arrhthymia-induced syncope. Also concern for orthostatic hypotension in setting of finasteride and flomax use with other anti-hypertensive medications.  -Cardiology to evaluate patient  -Monitor on telemetry  -Obtain 2D-echo and BNP level  -Obtain orthostatic labs -Hold home flomax 0.4 mg daily, finasteride 5 mg daily, carvedilol 6.25 mg BID, nifedipine 30 mg daily, and imdur 60 mg daily   New-Onset Atrial Fibrillation - Currently in afib with normal rate. Pt with CHADSVASC of 4. Etiology unclear, pt reports no prior history of afib.  -Cardiology to evaluate patient  -Monitor on telemetry  -Start IV heparin per pharmacy  -Hold home aspirin 81 mg daily and carvedilol 6.25 mg BID (resume once HR normalizes) -Obtain 2D-echo and TSH level  -Repeat 12-lead EKG in AM  CAD - Pt with no anginal symptoms. Pt reports having heart catherization approx 20 years ago that was medically managed.  -Hold home aspirin 81 mg daily and carvedilol 6.25 mg BID (resume once HR normalizes) -Hold home nifedipine 30 mg daily, and imdur 60 mg daily -Continue home simvastatin 80 mg daily  Hypertension - Currently mildly hypertensive at 149/65.  -Hold home flomax 0.4 mg daily, finasteride 5 mg daily, carvedilol 6.25 mg BID, nifedipine  30 mg daily, and imdur 60 mg daily -Continue to monitor   Hypothyroidism - No recent TSH level.  -Obtain TSH level  -Continue home synthroid 150 mcg daily   Hyperlipidemia - No recent lipid panel. -Obtain lipid panel  -Continue home simvastatin 80 mg daily  Normocytic Anemia - Hg 12.6 with no prior history of anemia. Pt denies active bleeding. -Obtain anemia panel  -Monitor CBC   CKD Stage 3 - Cr 1.32 with GFR 46 (last GFR 44 in 2014) with normal urine output. UA normal on admission. -Monitor BMP -Avoid nephrotoxins  BPH - Pt with chronic nocturia.  -Hold home flomax 0.4 mg daily and finasteride 5 mg daily  Chronic constipation - Stable.  -Start senokot-S daily   HIV Screening - Pt with no prior screening. -Obtain HIV Ab    Diet: Heart healthy  DVT Ppx: IV heparin  Code: DNR (confirmed with daughter-in-law and son at bedside)  Dispo: Disposition is deferred at this time, awaiting improvement of current medical problems.   The patient does have a current PCP (Rafael Bihari, MD) and does need an [facility] hospital follow-up appointment after discharge.  The patient does have transportation limitations that hinder transportation to clinic appointments.  Signed: Otis Brace, MD 03/18/2015, 11:02 AM

## 2015-03-18 NOTE — Progress Notes (Signed)
ANTICOAGULATION CONSULT NOTE - Initial Consult  Pharmacy Consult for heparin Indication: atrial fibrillation  No Known Allergies  Patient Measurements: Height:  (175.3 cm) Weight: 142 lb (64.411 kg) IBW/kg (Calculated) : 70.7 Heparin Dosing Weight: 64.4kg  Vital Signs: Temp: 98 F (36.7 C) (10/03 0856) Temp Source: Oral (10/03 0856) BP: 149/65 mmHg (10/03 1015) Pulse Rate: 43 (10/03 1015)  Labs:  Recent Labs  03/18/15 0913  HGB 12.6*  HCT 38.6*  PLT 263  LABPROT 14.3  INR 1.09  CREATININE 1.32*    Estimated Creatinine Clearance: 33.9 mL/min (by C-G formula based on Cr of 1.32).   Medical History: Past Medical History  Diagnosis Date  . Hypothyroid   . HLD (hyperlipidemia)   . HTN (hypertension)   . BPH (benign prostatic hyperplasia)   . Dysuria   . Arthritis     Medications:  Infusions:  . heparin      Assessment: 89 yom presented to the ED with syncope. To start IV heparin for afib. Baseline CBC and INR are WNL. He is not on anticoagulation PTA.   Goal of Therapy:  Heparin level 0.3-0.7 units/ml Monitor platelets by anticoagulation protocol: Yes   Plan:  - Heparin bolus 3500 units IV x 1 - Heparin gtt 900 units/hr - Check an 8 hour HL - Daily HL and CBC  Kayce Betty, Drake Le[place] 03/18/2015,11:21 AM

## 2015-03-18 NOTE — ED Notes (Signed)
Pt brought in via [facility]. Pt had two syncopal episodes. Pt was at home alone last night, pt got up to use the restroom and felt dizzy/weak. Pt hit his left shoulder on the shower door, and landed on the toilet. Pt called son and daughter in law, family came to get patient and took him back to their home. Pt woke up this morning and went to walk to the restroom. Pt had a brief loss of consciousness, and family assisted pt to the ground. Pt is A/O. EMS found pt to be in A-fib HR ranging from 90-130s. Pt denies any CP, SOB, and N/V.

## 2015-03-18 NOTE — Progress Notes (Signed)
Patient arrived on unit via stretcher from ED. Family at bedside. Telemetry placed per MD order and CMT notified.  

## 2015-03-19 ENCOUNTER — Other Ambulatory Visit: Payer: Self-pay | Admitting: Cardiology

## 2015-03-19 DIAGNOSIS — Z87438 Personal history of other diseases of male genital organs: Secondary | ICD-10-CM | POA: Diagnosis not present

## 2015-03-19 DIAGNOSIS — R55 Syncope and collapse: Secondary | ICD-10-CM

## 2015-03-19 DIAGNOSIS — I480 Paroxysmal atrial fibrillation: Secondary | ICD-10-CM

## 2015-03-19 DIAGNOSIS — J449 Chronic obstructive pulmonary disease, unspecified: Secondary | ICD-10-CM | POA: Diagnosis present

## 2015-03-19 LAB — HEMOGLOBIN A1C
HEMOGLOBIN A1C: 6.5 % — AB (ref 4.8–5.6)
MEAN PLASMA GLUCOSE: 140 mg/dL

## 2015-03-19 LAB — HIV ANTIBODY (ROUTINE TESTING W REFLEX): HIV SCREEN 4TH GENERATION: NONREACTIVE

## 2015-03-19 LAB — CBC
HCT: 36.4 % — ABNORMAL LOW (ref 39.0–52.0)
Hemoglobin: 12 g/dL — ABNORMAL LOW (ref 13.0–17.0)
MCH: 30.4 pg (ref 26.0–34.0)
MCHC: 33 g/dL (ref 30.0–36.0)
MCV: 92.2 fL (ref 78.0–100.0)
PLATELETS: 230 10*3/uL (ref 150–400)
RBC: 3.95 MIL/uL — AB (ref 4.22–5.81)
RDW: 14 % (ref 11.5–15.5)
WBC: 7.4 10*3/uL (ref 4.0–10.5)

## 2015-03-19 LAB — HEPARIN LEVEL (UNFRACTIONATED): Heparin Unfractionated: 0.33 IU/mL (ref 0.30–0.70)

## 2015-03-19 MED ORDER — TRAZODONE HCL 50 MG PO TABS
25.0000 mg | ORAL_TABLET | Freq: Every evening | ORAL | Status: DC | PRN
  Administered 2015-03-19: 25 mg via ORAL
  Filled 2015-03-19: qty 1

## 2015-03-19 MED ORDER — SENNOSIDES-DOCUSATE SODIUM 8.6-50 MG PO TABS: 1.0000 | ORAL_TABLET | Freq: Every evening | ORAL | Status: DC | PRN

## 2015-03-19 MED ORDER — MELATONIN 3 MG PO TABS
3.0000 mg | ORAL_TABLET | Freq: Every evening | ORAL | Status: DC | PRN
  Filled 2015-03-19: qty 1

## 2015-03-19 NOTE — H&P (Signed)
   Date: 03/19/2015  Patient name: Vincent Mendez.  Medical record number: [id number]  Date of birth: 08-03-1922   I have seen and evaluated Vincent Mendez. and discussed their care with the Residency Team.   Assessment and Plan: I have seen and evaluated the patient as outlined above. I agree with the formulated Assessment and Plan as detailed in the residents' admission note. Vincent Mendez was admitted yesterday after 2 episodes of syncope that occurred while he was standing in the bathroom urinating. He has a history of BPH and has undergone TURP. He denies significant urinary hesitancy and having to strain to start his urine stream. He normally has nocturia x3 and recently decided to stop drinking liquids around 4:30 in the afternoon to try to reduce the number of times he would have to get up at night to urinate. He has also been under a great deal of stress following the death of his wife several weeks ago. He admits that he has not been eating as well as he normally would. He also seems to recall that one of his doctors may have had him change one of his medications from the morning to the evening to try to prevent daytime dizziness. He is not sure which medication was changed to oral when it was changed. I suspect that his syncope was multifactorial with components of micturition syncope complicated by a relative nighttime volume depletion and recent stress/grief. I suggested to him that he sit on the toilet when urinating but he states that this makes it more difficult for him to pass his urine. He was in atrial fibrillation upon admission but had spontaneous conversion to sinus rhythm with PACs. I agree that he is not a current candidate for anticoagulation given his syncope. Dr. Jens Som will arrange outpatient cardiac monitoring. Given his health problems and recent death of his wife he will be moving from his home in [place] to live with his son and daughter-in-law here outside of  [place]. This may make it difficult for him to follow up with his primary care provider in [place]. We will discuss discharge plans with his daughter-in-law, Kriste Basque, and plan on discharge this afternoon.  Cliffton Asters, MD 10/4/20162:56 PM

## 2015-03-19 NOTE — Progress Notes (Addendum)
Patient asked for medication to help him sleep.  Paged on Call physician.  Physician placed order for sleeping aid   Pura Spice, RN

## 2015-03-19 NOTE — Discharge Instructions (Signed)
Please keep your follow up appointments and be sure to pick up your heart monitor from the cardiologist's office.   Also, please be very careful when urinating and standing and this may increase your risk of falls.  We advise that you sit down when you urinate to avoid falls.

## 2015-03-19 NOTE — Progress Notes (Addendum)
    Subjective:  Denies CP or dyspnea   Objective:  Filed Vitals:   03/18/15 1411 03/18/15 1729 03/18/15 2020 03/19/15 0433  BP:  123/44 137/72 137/67  Pulse:  84 94 88  Temp:  98.4 F (36.9 C) 98.5 F (36.9 C) 97.8 F (36.6 C)  TempSrc:  Oral Oral Oral  Resp:  Height:  (1.753 m)     Weight:   64.6 kg (142 lb 6.7 oz)   SpO2:  98% 99% 99%    Intake/Output from previous day:  Intake/Output Summary (Last 24 hours) at 03/19/15 0936 Last data filed at 03/19/15 0601  Gross per 24 hour  Intake 413.84 ml  Output    500 ml  Net -86.16 ml    Physical Exam: Physical exam: Well-developed well-nourished in no acute distress.  Skin is warm and dry.  HEENT is normal.  Neck is supple.  Chest is clear to auscultation with normal expansion.  Cardiovascular exam is regular rate and rhythm.  Abdominal exam nontender or distended. No masses palpated. Extremities show no edema. neuro grossly intact    Lab Results: Basic Metabolic Panel:  Recent Labs  40/98/11 0913 03/18/15 1229  NA 140  --   K 4.0  --   CL 107  --   CO2 26  --   GLUCOSE 124*  --   BUN 20  --   CREATININE 1.32*  --   CALCIUM 9.1  --   MG  --  2.2  PHOS  --  3.0   CBC:  Recent Labs  03/18/15 0913 03/19/15 0705  WBC 6.8 7.4  HGB 12.6* 12.0*  HCT 38.6* 36.4*  MCV 91.9 92.2  PLT 263 230   Cardiac Enzymes:  Recent Labs  03/18/15 1600 03/18/15 2200  TROPONINI <0.03 <0.03     Assessment/Plan:  1 syncope-based on history micturition syncope seems likely. LV function normal on echocardiogram. I have reviewed telemetry since admission which reveals sinus rhythm with PACs. Plan outpatient event monitor.  Patient instructed not to drive for 6 months. 2 paroxysmal atrial fibrillation-patient remains in sinus rhythm. Continue carvedilol. He would benefit from anticoagulation long-term. However I feel the risk outweighs the benefit given unexplained syncope. If he does not have  recurrences in the near future we will consider beginning anticoagulation at that point. Note his chadsvasc is 4. Discontinue heparin. 3 hypertension-continue present medications. 4 history of coronary artery disease-continue aspirin and statin. Patient can be discharged from a cardiac standpoint. Follow-up with me in approximately 4 weeks.  Olga Millers 03/19/2015, 9:36 AM

## 2015-03-19 NOTE — Evaluation (Signed)
Occupational Therapy Evaluation & Discharge Patient Details Name: EDUIN FRIEDEL Sr. MRN: [id number] DOB: [birth year] Today's Date: 03/19/2015    History of Present Illness Bernardino Dowell is a very pleasant 81 year old man with past medical history of hypertension, hyperlipidemia, CAD, BPH s/p TURP, CKD Stage 3, and OA who presents with chief complaint of dizziness   Clinical Impression   Pt admitted to hospital due to reason stated above. Pt currently with functional limitiations due to the deficits listed below (see OT problem list). Prior to admission pt was driving and independent with ADLs/IADLs. Pt currently requires supervision to min guard assist for safety while engaging in ADLs. Pt completed DGI with good results, scoring 21/24. Pt reports recent falls are due to onset of dizziness while voiding. Pt reports he will be living with son and daughter-in-law, who can provide supervision as needed. Pt at baseline, no further OT acute needs required, however therapist feels pt will benefit from supervision PRN to increase safety with ADLs and balance.    Follow Up Recommendations  Supervision - Intermittent    Equipment Recommendations  None recommended by OT    Recommendations for Other Services       Precautions / Restrictions Precautions Precautions: Fall Restrictions Weight Bearing Restrictions: No      Mobility Bed Mobility Overal bed mobility: Needs Assistance Bed Mobility: Supine to Sit     Supine to sit: Supervision        Transfers Overall transfer level: Needs assistance Equipment used: Rolling walker (2 wheeled) Transfers: Sit to/from Stand Sit to Stand: Min guard         General transfer comment: Min guard for safety and verbal cues for hand placement    Balance Overall balance assessment: Needs assistance Sitting-balance support: No upper extremity supported;Feet supported Sitting balance-Leahy Scale: Good     Standing balance support:  Single extremity supported;During functional activity Standing balance-Leahy Scale: Fair Standing balance comment: Single UE supported while standing at sink to perform groom task                 Standardized Balance Assessment Standardized Balance Assessment : Dynamic Gait Index   Dynamic Gait Index Level Surface: Normal Change in Gait Speed: Normal Gait with Horizontal Head Turns: Normal Gait with Vertical Head Turns: Mild Impairment Gait and Pivot Turn: Mild Impairment (wide pivot turn) Step Over Obstacle: Normal Step Around Obstacles: Normal Steps: Mild Impairment Total Score: 21      ADL Overall ADL's : Needs assistance/impaired Eating/Feeding: Independent   Grooming: Wash/dry face;Oral care;Min guard;Standing   Upper Body Bathing: Supervision/ safety;Sitting   Lower Body Bathing: Supervison/ safety;Sit to/from stand   Upper Body Dressing : Supervision/safety;Sitting   Lower Body Dressing: Supervision/safety;Sit to/from stand   Toilet Transfer: Min guard;Ambulation;RW;Comfort height toilet   Toileting- Clothing Manipulation and Hygiene: Min guard;Sit to/from stand   Tub/ Shower Transfer: Walk-in shower;Min guard;Ambulation;Rolling walker   Functional mobility during ADLs: Min guard;Rolling walker General ADL Comments: Pt has equipment at home such as built-in shower seat, hand held shower head and grab bars by toilet and in shower to provide safety with bathing. Pt able to don UE gown and don bil LE socks with supervision for safety. Therapist recommended pt sit while showering to increase safety and decrease risk of pt falling. Pt agreeable to therapist recommendation     Vision     Perception     Praxis      Pertinent Vitals/Pain Pain Assessment: No/denies pain  Hand Dominance Right   Extremity/Trunk Assessment Upper Extremity Assessment Upper Extremity Assessment: Overall WFL for tasks assessed   Lower Extremity Assessment Lower Extremity  Assessment: Defer to PT evaluation   Cervical / Trunk Assessment Cervical / Trunk Assessment: Normal   Communication Communication Communication: No difficulties;HOH   Cognition Arousal/Alertness: Awake/alert Behavior During Therapy: WFL for tasks assessed/performed Overall Cognitive Status: Within Functional Limits for tasks assessed                     General Comments    Pt reports recent falls are due to onset of dizziness when urinating, which leads to the pt feeling faint. Pt now lives alone, wife passed away ~two weeks ago. Pt states he may be going to live with his son and daughter-in-law in the next couple of weeks.    Exercises       Shoulder Instructions      Home Living Family/patient expects to be discharged to:: Private residence Living Arrangements: Alone Available Help at Discharge: Family;Available PRN/intermittently Type of Home: House Home Access: Stairs to enter [employer] of Steps: 3 Entrance Stairs-Rails: Right Home Layout: Multi-level (Pt's bedroom is on main level) Alternate Level Stairs-Number of Steps: flight  Alternate Level Stairs-Rails: Can reach both Bathroom Shower/Tub: Producer, television/film/video: Handicapped height Bathroom Accessibility: Yes How Accessible: Accessible via walker Home Equipment: Walker - 2 wheels;Cane - quad;Cane - single point;Bedside commode;Shower seat - built in;Grab bars - tub/shower;Hand held shower head   Additional Comments: Home equipment belonged to deceased wife, pt feels he can benefit from using equipment now due to recent falls. Pt currently drives however, reports MD indicated he would not be able to drive for ~1OXWRUE.      Prior Functioning/Environment Level of Independence: Independent             OT Diagnosis: Generalized weakness   OT Problem List: Decreased strength;Impaired balance (sitting and/or standing);Decreased knowledge of use of DME or AE   OT  Treatment/Interventions:      OT Goals(Current goals can be found in the care plan section) Acute Rehab OT Goals Patient Stated Goal: return home  OT Frequency:     Barriers to D/C:            Co-evaluation              End of Session Equipment Utilized During Treatment: Gait belt;Rolling walker  Activity Tolerance: Patient tolerated treatment well Patient left: in chair;with call bell/phone within reach;with chair alarm set   Time: 4540-9811 OT Time Calculation (min): 43 min Charges:    G-Codes:    Smiley Houseman 2015/04/11, 10:09 AM

## 2015-03-19 NOTE — Discharge Summary (Signed)
Name: Vincent VENCES Sr. MRN: [id number] DOB: 12-Mar-1923 81 y.o. PCP: Vincent Bihari, MD  Date of Admission: 03/18/2015  8:39 AM Date of Discharge: 03/19/2015 Attending Physician: Vincent Asters, MD  Discharge Diagnosis: 1. Syncope Principal Problem:   Syncope Active Problems:   BPH with obstruction/lower urinary tract symptoms   Paroxysmal atrial fibrillation (HCC)   Coronary artery disease   Anemia   CKD (chronic kidney disease), stage III   Hyperlipidemia   Hypertension   Hypothyroidism   Chronic constipation   B12 deficiency   COPD (chronic obstructive pulmonary disease) (HCC)  Discharge Medications:   Medication List    STOP taking these medications        naproxen 250 MG tablet  Commonly known as:  NAPROSYN      TAKE these medications        aspirin EC 81 MG tablet  Take by mouth.     carvedilol 6.25 MG tablet  Commonly known as:  COREG  Take 6.25 mg by mouth 2 (two) times daily with a meal.     finasteride 5 MG tablet  Commonly known as:  PROSCAR  Take by mouth.     isosorbide mononitrate 60 MG 24 hr tablet  Commonly known as:  IMDUR  Take by mouth.     levothyroxine 150 MCG tablet  Commonly known as:  SYNTHROID, LEVOTHROID  Take by mouth.     Melatonin 5 MG Caps  Take 5 mg by mouth at bedtime.     NIFEdipine 30 MG 24 hr tablet  Commonly known as:  PROCARDIA-XL/ADALAT-CC/NIFEDICAL-XL  Take by mouth.     omeprazole 20 MG capsule  Commonly known as:  PRILOSEC  Take by mouth.     polyethylene glycol packet  Commonly known as:  MIRALAX / GLYCOLAX  Take 17 g by mouth daily.     senna-docusate 8.6-50 MG tablet  Commonly known as:  Senokot-S  Take 1 tablet by mouth at bedtime as needed for mild constipation.     simvastatin 80 MG tablet  Commonly known as:  ZOCOR  Take by mouth.     tamsulosin 0.4 MG Caps capsule  Commonly known as:  FLOMAX  Take 1 capsule (0.4 mg total) by mouth daily.        Disposition and follow-up:     Vincent L Jemison Sr. was discharged from [facility] in Good condition.  At the hospital follow up visit please address:  1.  Syncope: Please assess whether patient has any more episodes of syncope, near-syncope, or lightheadedness/dizziness. Patient's syncope was likely a micturition syncope, will need to follow up on ability to urinate without issue and reassess BPH medications if indicated.  Paroxysmal Atrial Fibrillation: Patient to wear holter monitor to assess for further arrythmia episodes (was in sinus rhythm with frequent PACs on discharge). Will likely need anticoagulation (CHADSVASC 4) but not indicated during hospital stay due to recent fall history.  Grief: Patient in grief due to recent passing of his wife. Will live with his son and daughter-in-law for some time as he should not drive for 6 months. Please assess his grief and consider grief counseling if indicated.  2.  Labs / imaging needed at time of follow-up: none  3.  Pending labs/ test needing follow-up: none  Follow-up Appointments:     Follow-up Information    Follow up with [facility] On 03/20/2015.   Why:  to pick up heart monitor @ 10:00  am    Contact information:   [address] [place] [address]       Follow up with Vincent Derrick, PA-C On 04/29/2015.   Specialties:  Cardiology, Radiology   Why:  9:45 (cardiology follow-up)   Contact information:   [address] [place] [address] [phone]       Follow up with Vincent Bihari, MD On 03/28/2015.   Specialty:  Internal Medicine   Why:  8:30AM (hospital follow up appointment)   Contact information:   [address] [place] [address] [phone]       Discharge Instructions: Discharge Instructions    Call MD for:  extreme fatigue    Complete by:  As directed      Call MD for:  persistant dizziness or light-headedness    Complete by:  As directed       Diet - low sodium heart healthy    Complete by:  As directed      Discharge instructions    Complete by:  As directed   Please follow up with Dr. Dan Mendez and cardiology.  Also please try and sit down when urinating to avoid falls.     Increase activity slowly    Complete by:  As directed            Consultations: Treatment Team:  Rounding Lbcardiology, MD  Procedures Performed:  Dg Chest 2 View  03/18/2015   CLINICAL DATA:  Syncopal episode last night, resulting in a fall.  EXAM: CHEST  2 VIEW  COMPARISON:  None.  FINDINGS: Normal sized heart. Clear lungs. The lungs are mildly hyperexpanded. Small amount of linear density in the lingula. Thoracic spine degenerative changes. No fracture or pneumothorax seen.  IMPRESSION: 1. Mild atelectasis or scarring in the lingula. 2. COPD.   Electronically Signed   By: Vincent Mendez M.D.   On: 03/18/2015 10:07    2D Echo: 03/18/15 Left ventricle: The cavity size was normal. Wall thickness was normal. Systolic function was normal. The estimated ejection fraction was in the range of 60% to 65%. Wall motion was normal; there were no regional wall motion abnormalities.  Cardiac Cath: n/a  Admission HPI: Vincent Mendez is a very pleasant 81 year old man with past medical history of hypertension, hyperlipidemia, CAD, BPH s/p TURP, CKD Stage 3, and OA who presents with chief complaint of dizziness.   He is accompanied by his son and daughter-in-law.  He reports feeling his normal self until last night when he felt lightheaded after urinating and consequently fell on the toliet after hitting his right shoulder on the shower door. He called his daughter-in-law who then brought him to their house to stay the night. This morning around 6 AM he again felt lightheaded after urinating in the bathroom in addition to feeling diaphoretic, weak, and nauseous. His daughter-in-law then found him slumped over on his knees after which he was unresponsiveness for  a few seconds. He denies head trauma, chest pain, dyspnea, orthopnea, PND, LE edema, paraesthesias, focal weakness, slurred speech, confusion, tongue biting, bowel/bladder incontinence, seizure activity, or abdominal pain. He reports having lightheadedness with standing about 2.5 years ago that resolved after adjustment in his medications. He denies history of atrial fibrillation or CHF. He has history of CAD and had heart catherization many years ago (20 yrs) that was managed with medical therapy. He has history of hypothyroidism which he is compliant with replacement therapy. He has history  of BPH for which he is on finasteride and flomax for. He reports mildly reduced to good PO intake.   He currently reports being back to his baseline. Of note his wife of many years passed away 1 week ago.  Hospital Course by problem list: Principal Problem:   Syncope Active Problems:   BPH with obstruction/lower urinary tract symptoms   Paroxysmal atrial fibrillation (HCC)   Coronary artery disease   Anemia   CKD (chronic kidney disease), stage III   Hyperlipidemia   Hypertension   Hypothyroidism   Chronic constipation   B12 deficiency   COPD (chronic obstructive pulmonary disease) (HCC)   Syncope: Likely micturition syncope based on history. Imaging workup without acute abnormalities. May also be due to multiple factors in patient's life including stress with recent passing of his wife, paroxysmal atrial fibrillation, decreased fluid intake and appetitie, or medication related. Patient without symptoms during hospital stay. Have advised patient on methods to decrease risk for fall including increasing his current fluid intake to avoid dehydration and sitting down when going to the bathroom.  Paroxysmal Atrial Fibrillation: Patient presented with new onset atrial fibrillation which converted to sinus rhythm with frequent PACs. Has CHADSVASC of 4. He did not have any chest pain, palpitations, or dyspnea.  Not started on anticoagulation as not considered a good candidate at this time due to recent falls, but will likely need in future. To wear holter monitor after discharge and follow up with cardiology and PCP.  Discharge Vitals:   BP 137/67 mmHg  Pulse 88  Temp(Src) 97.8 F (36.6 C) (Oral)  Resp 16  Ht  (1.753 m)  Wt 142 lb 6.7 oz (64.6 kg)  BMI 21.02 kg/m2  SpO2 99%  Discharge Labs:  Results for orders placed or performed during the hospital encounter of 03/18/15 (from the past 24 hour(s))  Troponin I     Status: None   Collection Time: 03/18/15  4:00 PM  Result Value Ref Range   Troponin I <0.03 <0.031 ng/mL  Heparin level (unfractionated)     Status: Abnormal   Collection Time: 03/18/15  7:00 PM  Result Value Ref Range   Heparin Unfractionated 0.29 (L) 0.30 - 0.70 IU/mL  Troponin I     Status: None   Collection Time: 03/18/15 10:00 PM  Result Value Ref Range   Troponin I <0.03 <0.031 ng/mL  Heparin level (unfractionated)     Status: None   Collection Time: 03/19/15  7:05 AM  Result Value Ref Range   Heparin Unfractionated 0.33 0.30 - 0.70 IU/mL  CBC     Status: Abnormal   Collection Time: 03/19/15  7:05 AM  Result Value Ref Range   WBC 7.4 4.0 - 10.5 K/uL   RBC 3.95 (L) 4.22 - 5.81 MIL/uL   Hemoglobin 12.0 (L) 13.0 - 17.0 g/dL   HCT 16.1 (L) 09.6 - 04.5 %   MCV 92.2 78.0 - 100.0 fL   MCH 30.4 26.0 - 34.0 pg   MCHC 33.0 30.0 - 36.0 g/dL   RDW 40.9 81.1 - 91.4 %   Platelets 230 150 - 400 K/uL    Signed: Darreld Mclean, MD 03/19/2015, 3:26 PM    Services Ordered on Discharge: none Equipment Ordered on Discharge: none

## 2015-03-19 NOTE — Progress Notes (Signed)
   Subjective: Patient feels well today, no dyspnea, chest pain, or lightheadedness/dizziness. He is understandably emotional about recent passing of his wife of 60+ years. Objective: Vital signs in last 24 hours: Filed Vitals:   03/18/15 1411 03/18/15 1729 03/18/15 2020 03/19/15 0433  BP:  123/44 137/72 137/67  Pulse:  84 94 88  Temp:  98.4 F (36.9 C) 98.5 F (36.9 C) 97.8 F (36.6 C)  TempSrc:  Oral Oral Oral  Resp:  Height:  (1.753 m)     Weight:   142 lb 6.7 oz (64.6 kg)   SpO2:  98% 99% 99%   Weight change:   Intake/Output Summary (Last 24 hours) at 03/19/15 1528 Last data filed at 03/19/15 1200  Gross per 24 hour  Intake 653.84 ml  Output   1000 ml  Net -346.16 ml   General: resting in bed Cardiac: RRR, no rubs, murmurs or gallops Pulm: clear to auscultation bilaterally, moving normal volumes of air Abd: soft, nontender, nondistended, BS present Ext: no pedal edema Neuro: alert and oriented X3  Assessment/Plan: Principal Problem:   Syncope Active Problems:   BPH with obstruction/lower urinary tract symptoms   Paroxysmal atrial fibrillation (HCC)   Coronary artery disease   Anemia   CKD (chronic kidney disease), stage III   Hyperlipidemia   Hypertension   Hypothyroidism   Chronic constipation   B12 deficiency   COPD (chronic obstructive pulmonary disease) (HCC)  Syncope: Likely micturition syncope based on history. May also be due to multiple factors in patient's life including stress with recent passing of his wife, paroxysmal atrial fibrillation, decreased fluid intake and appetitie, or medication related. Have advised patient on methods to decrease risk for fall including increasing his current fluid intake and sitting down when going to the bathroom. -holter monitor to further evaluate for arrythmia -discharge today  Paroxysmal Atrial Fibrillation: Patient presented with new onset atrial fibrillation yesterday which converted to sinus  rhythm with frequent PACs. Has CHADSVASC of 4. Denies any chest pain or palpitations. Will need anticoagulation in the future but not a good candidate at this time due to recent falls. -Discharge today with Holter monitor on outpatient basis, f/u with Cardiology -Patient not to drive for 6 months -Continue Coreg 6.25 mg po BID -Continue aspirin 81 mg  Dispo:  Anticipated discharge today.   The patient does have a current PCP (Rafael Bihari, MD) and does not need an [facility] hospital follow-up appointment after discharge.     LOS: 1 day   Darreld Mclean, MD 03/19/2015, 3:28 PM

## 2015-03-19 NOTE — Evaluation (Signed)
Physical Therapy Evaluation Patient Details Name: Vincent KUANG Sr. MRN: [id number] DOB: 04-Mar-1923 Today's Date: 03/19/2015   History of Present Illness  Vincent Mendez is a very pleasant 81 year old man with past medical history of hypertension, hyperlipidemia, CAD, BPH s/p TURP, CKD Stage 3, and OA who presents with chief complaint of dizziness, 2 falls in bathroom; Of note, his wife passed away 2 weeks ago  Clinical Impression   Pt admitted with above diagnosis. Pt currently with functional limitations due to the deficits listed below (see PT Problem List).  Pt will benefit from skilled PT to increase their independence and safety with mobility to allow discharge to the venue listed below.      03/19/15 0906  Vital Signs  Patient Position (if appropriate) Orthostatic Vitals  Orthostatic Lying   BP- Lying 164/69 mmHg  Pulse- Lying 85  Orthostatic Sitting  BP- Sitting 158/72 mmHg  Pulse- Sitting 86  Orthostatic Standing at 0 minutes  BP- Standing at 0 minutes 132/78 mmHg  Pulse- Standing at 0 minutes 87  Orthostatic Standing at 3 minutes  BP- Standing at 3 minutes 145/68 mmHg (After hallway amb)  Pulse- Standing at 3 minutes 77   Notable for drop in SBP between lying and standing; Pt asymptomatic; Will likely meet goals in one session     Follow Up Recommendations No PT follow up    Equipment Recommendations  None recommended by PT    Recommendations for Other Services       Precautions / Restrictions Precautions Precautions: Fall      Mobility  Bed Mobility                  Transfers Overall transfer level: Needs assistance Equipment used: None Transfers: Sit to/from Stand Sit to Stand: Min guard         General transfer comment: Minguard for safety; cues to self-monitor for activity tolerance  Ambulation/Gait Ambulation/Gait assistance: Min guard;Supervision Ambulation Distance (Feet): 180 Feet Assistive device: None (and pushing  IV pole) Gait Pattern/deviations: WFL(Within Functional Limits)     General Gait Details: Cues to self-monitor for activity tolerance; Asymptomatic for dizziness during walk; minguard assist progressing to supervision; Gait pattern was better without pushing IV pole  Stairs            Wheelchair Mobility    Modified Rankin (Stroke Patients Only)       Balance     Sitting balance-Leahy Scale: Good       Standing balance-Leahy Scale: Fair                               Pertinent Vitals/Pain Pain Assessment: No/denies pain    Home Living Family/patient expects to be discharged to:: Private residence Living Arrangements: Alone Available Help at Discharge: Family;Available PRN/intermittently Type of Home: House Home Access: Stairs to enter Entrance Stairs-Rails: Right Entrance Stairs-Number of Steps: 3 Home Layout: Multi-level (Pt's bedroom is on main level) Home Equipment: Walker - 2 wheels;Cane - quad;Cane - single point;Bedside commode;Shower seat - built in;Grab bars - tub/shower;Hand held shower head      Prior Function Level of Independence: Independent               Hand Dominance   Dominant Hand: Right    Extremity/Trunk Assessment   Upper Extremity Assessment: Overall WFL for tasks assessed           Lower Extremity Assessment: Overall WFL for  tasks assessed      Cervical / Trunk Assessment: Normal  Communication   Communication: No difficulties;HOH  Cognition Arousal/Alertness: Awake/alert Behavior During Therapy: WFL for tasks assessed/performed Overall Cognitive Status: Within Functional Limits for tasks assessed                      General Comments General comments (skin integrity, edema, etc.): Pt reports recent falls have been due to onset of dizziness when urinating, which leads to the pt feeling faint. Pt now lives alone, wife passed away two weeks ago. Pt states he may be going to live with his son and  daughter -in law in the next couple of weeks.    Exercises        Assessment/Plan    PT Assessment Patient needs continued PT services  PT Diagnosis Difficulty walking;Other (comment) (Syncope)   PT Problem List Decreased activity tolerance;Decreased balance;Cardiopulmonary status limiting activity  PT Treatment Interventions DME instruction;Gait training;Stair training;Functional mobility training;Therapeutic activities;Therapeutic exercise;Balance training;Patient/family education   PT Goals (Current goals can be found in the Care Plan section) Acute Rehab PT Goals Patient Stated Goal: return home PT Goal Formulation: With patient Time For Goal Achievement: 03/26/15 Potential to Achieve Goals: Good    Frequency Min 2X/week (Will likely meet goals next session)   Barriers to discharge        Co-evaluation               End of Session Equipment Utilized During Treatment: Gait belt Activity Tolerance: Patient tolerated treatment well Patient left: in chair;with call bell/phone within reach;with chair alarm set Nurse Communication: Mobility status         Time: 1610-9604 PT Time Calculation (min) (ACUTE ONLY): 30 min   Charges:   PT Evaluation $Initial PT Evaluation Tier I: 1 Procedure PT Treatments $Gait Training: 8-22 mins   PT G CodesVan Mendez Mendez 03/19/2015, 11:12 AM  Vincent Mendez, PT  Acute Rehabilitation Services Pager [phone] Office [phone]

## 2015-03-20 ENCOUNTER — Ambulatory Visit (INDEPENDENT_AMBULATORY_CARE_PROVIDER_SITE_OTHER): Payer: Medicare Other

## 2015-03-20 DIAGNOSIS — R55 Syncope and collapse: Secondary | ICD-10-CM

## 2015-03-20 DIAGNOSIS — I480 Paroxysmal atrial fibrillation: Secondary | ICD-10-CM | POA: Diagnosis not present

## 2015-03-28 ENCOUNTER — Telehealth: Payer: Self-pay | Admitting: Cardiology

## 2015-03-28 NOTE — Telephone Encounter (Signed)
Patient has follow up visit with Corine ShelterLuke Kilroy PA 04/29/15.

## 2015-03-28 NOTE — Telephone Encounter (Signed)
Make sure pt has fuov Vincent MillersBrian Nehal Mendez

## 2015-03-28 NOTE — Telephone Encounter (Signed)
Vincent Mendez called to alert for abnormal EKG that he is faxing over for this patient. Onset of A Fib, triggered. Rate of 110. [facility] attempted to call patient, he did not pickup.

## 2015-03-28 NOTE — Telephone Encounter (Signed)
Romeo AppleBen is calling in to report an abnormal EKG

## 2015-04-22 NOTE — Progress Notes (Signed)
Physical Therapy Note  (Late entry for G Code correction)    04/22/15 1300  PT G-Codes **NOT FOR INPATIENT CLASS**  Functional Assessment Tool Used Clinical Judgement  Functional Limitation Mobility: Walking and moving around  Mobility: Walking and Moving Around Current Status ([id number](G8978) CI  Mobility: Walking and Moving Around Goal Status [phone](G8979) CH   Van ClinesHolly Casi Westerfeld, PT  Acute Rehabilitation Services Pager [phone] Office [phone]

## 2015-04-29 ENCOUNTER — Ambulatory Visit (INDEPENDENT_AMBULATORY_CARE_PROVIDER_SITE_OTHER): Payer: Medicare Other | Admitting: Cardiology

## 2015-04-29 ENCOUNTER — Encounter: Payer: Self-pay | Admitting: Cardiology

## 2015-04-29 VITALS — BP 128/72 | HR 95 | Ht 69.0 in | Wt 150.0 lb

## 2015-04-29 DIAGNOSIS — R55 Syncope and collapse: Secondary | ICD-10-CM

## 2015-04-29 DIAGNOSIS — I480 Paroxysmal atrial fibrillation: Secondary | ICD-10-CM | POA: Diagnosis not present

## 2015-04-29 DIAGNOSIS — I2510 Atherosclerotic heart disease of native coronary artery without angina pectoris: Secondary | ICD-10-CM | POA: Diagnosis not present

## 2015-04-29 DIAGNOSIS — R5383 Other fatigue: Secondary | ICD-10-CM

## 2015-04-29 DIAGNOSIS — I10 Essential (primary) hypertension: Secondary | ICD-10-CM

## 2015-04-29 LAB — CBC
HCT: 38.7 % — ABNORMAL LOW (ref 39.0–52.0)
Hemoglobin: 12.8 g/dL — ABNORMAL LOW (ref 13.0–17.0)
MCH: 30.7 pg (ref 26.0–34.0)
MCHC: 33.1 g/dL (ref 30.0–36.0)
MCV: 92.8 fL (ref 78.0–100.0)
MPV: 9.9 fL (ref 8.6–12.4)
Platelets: 265 10*3/uL (ref 150–400)
RBC: 4.17 MIL/uL — ABNORMAL LOW (ref 4.22–5.81)
RDW: 14 % (ref 11.5–15.5)
WBC: 11.2 10*3/uL — ABNORMAL HIGH (ref 4.0–10.5)

## 2015-04-29 LAB — BASIC METABOLIC PANEL
BUN: 23 mg/dL (ref 7–25)
CO2: 26 mmol/L (ref 20–31)
Calcium: 8.8 mg/dL (ref 8.6–10.3)
Chloride: 101 mmol/L (ref 98–110)
Creat: 1.24 mg/dL — ABNORMAL HIGH (ref 0.70–1.11)
Glucose, Bld: 113 mg/dL — ABNORMAL HIGH (ref 65–99)
Potassium: 4.6 mmol/L (ref 3.5–5.3)
Sodium: 137 mmol/L (ref 135–146)

## 2015-04-29 LAB — MAGNESIUM: Magnesium: 2.2 mg/dL (ref 1.5–2.5)

## 2015-04-29 MED ORDER — HYDROCHLOROTHIAZIDE 12.5 MG PO CAPS: 12.5000 mg | ORAL_CAPSULE | ORAL | Status: DC

## 2015-04-29 MED ORDER — DILTIAZEM HCL ER COATED BEADS 120 MG PO CP24: 120.0000 mg | ORAL_CAPSULE | Freq: Every day | ORAL | Status: DC

## 2015-04-29 MED ORDER — SIMVASTATIN 80 MG PO TABS: 40.0000 mg | ORAL_TABLET | Freq: Every day | ORAL | Status: DC

## 2015-04-29 NOTE — Assessment & Plan Note (Signed)
Recent hospitalization noted to have PAF. He is back in AF today with VR 95. He has had DOE and LE edema

## 2015-04-29 NOTE — Assessment & Plan Note (Signed)
Controlled.  

## 2015-04-29 NOTE — Progress Notes (Signed)
04/29/2015 Vincent Cash Sr.   12-29-1922  [id number]  Primary Physician Joycelyn Rua, MD Primary Cardiologist: Dr Jens Som  HPI:  81 year old male, whose wife passed away 4 weeks ago, with a history of coronary artery disease, hypothyroidism, hyperlipidemia, hypertension, BPH and arthritis. Patient had a left heart catheterization 15-20 years ago. He had a lesion posteriorly that could not be engaged. He's had no issues with this since and has not seen a cardiologist since that time. This was done at [facility]. He presents with new atrial fibrillation, dizziness,  and history of a  syncopal episode 03/18/15. In the hospital an echo showed normal LVF. He converted spontaneously to NSR. He was felt to be too high for anticoagulation with his history of micturition syncope, (CHADs VASc=4).    He is in the office today for follow up. He had an umbilical hernia repair in [facility] two weeks ago. Since then he has had DOE, fatigue, and LE edema. EKG in the office today shows he is in AF/ flutter with VR 95.   Current Outpatient Prescriptions  Medication Sig Dispense Refill  . aspirin EC 81 MG tablet Take by mouth.    . carvedilol (COREG) 6.25 MG tablet Take 6.25 mg by mouth 2 (two) times daily with a meal.     . finasteride (PROSCAR) 5 MG tablet Take by mouth.    . isosorbide mononitrate (IMDUR) 60 MG 24 hr tablet Take by mouth.    . levothyroxine (SYNTHROID, LEVOTHROID) 150 MCG tablet Take by mouth.    . Melatonin 5 MG CAPS Take 5 mg by mouth at bedtime.    Marland Kitchen omeprazole (PRILOSEC) 20 MG capsule Take by mouth.    . polyethylene glycol (MIRALAX / GLYCOLAX) packet Take 17 g by mouth daily.    Marland Kitchen senna-docusate (SENOKOT-S) 8.6-50 MG tablet Take 1 tablet by mouth at bedtime as needed for mild constipation.    . simvastatin (ZOCOR) 80 MG tablet Take 0.5 tablets (40 mg total) by mouth daily at 6 PM. 90 tablet 0  . tamsulosin (FLOMAX) 0.4 MG CAPS capsule Take 1 capsule (0.4 mg  total) by mouth daily. 30 capsule 12  . diltiazem (CARDIZEM CD) 120 MG 24 hr capsule Take 1 capsule (120 mg total) by mouth daily. 30 capsule 6  . hydrochlorothiazide (MICROZIDE) 12.5 MG capsule Take 1 capsule (12.5 mg total) by mouth every Monday, Wednesday, and Friday. 36 capsule 3   No current facility-administered medications for this visit.    No Known Allergies  Social History   Social History  . Marital Status: Married    Spouse Name: N/A  . Number of Children: N/A  . Years of Education: N/A   Occupational History  . Not on file.   Social History Main Topics  . Smoking status: Former Games developer  . Smokeless tobacco: Not on file  . Alcohol Use: No  . Drug Use: No  . Sexual Activity: Not on file   Other Topics Concern  . Not on file   Social History Narrative     Review of Systems: General: negative for chills, fever, night sweats or weight changes.  Cardiovascular: negative for chest pain, dyspnea on exertion, edema, orthopnea, palpitations, paroxysmal nocturnal dyspnea or shortness of breath Dermatological: negative for rash Respiratory: negative for cough or wheezing Urologic: negative for hematuria Abdominal: negative for nausea, vomiting, diarrhea, bright red blood per rectum, melena, or hematemesis Neurologic: negative for visual changes, syncope, or dizziness All other systems reviewed and  are otherwise negative except as noted above.    Blood pressure 128/72, pulse 95, height 5\' 9"  (1.753 m), weight 150 lb (68.04 kg).  General appearance: alert, cooperative and no distress Neck: no carotid bruit and no JVD Lungs: clear to auscultation bilaterally Heart: irregularly irregular rhythm Extremities: 1+ edema Skin: Skin color, texture, turgor normal. No rashes or lesions Neurologic: Grossly normal  EKG AF/ flutter with variable block- VR 95  ASSESSMENT AND PLAN:   Paroxysmal atrial fibrillation (HCC) Recent hospitalization noted to have PAF. He is back in  AF today with VR 95. He has had DOE and LE edema  CKD (chronic kidney disease), stage III Last SCr 1.32  Hyperlipidemia His is on Zocor 80 mg- LDL was recently 70  Coronary artery disease Patient apparently had a left heart catheterization 15-20 years ago.  He had a lesion posteriorly that could not be engaged. [facility] He has had no angina. LVF normal 03/18/15  Hypertension Controlled  Syncope Micturition syncope, not felt to be a candidate for anticoagulation.    PLAN  I changed his Procardia to Diltiazem 120 mg daily, (decreased Zocor to 40 mg), and added HCTZ 12.5 mg MWF to his current medications. I'll see him back in two weeks. If he is still in AF and symptomatic will need to discuss options with Dr Jens Somrenshaw.   Vincent ShelterKILROY,Vincent Mendez 04/29/2015 2:46 PM

## 2015-04-29 NOTE — Assessment & Plan Note (Signed)
His is on Zocor 80 mg- LDL was recently 70

## 2015-04-29 NOTE — Assessment & Plan Note (Signed)
Micturition syncope, not felt to be a candidate for anticoagulation.

## 2015-04-29 NOTE — Assessment & Plan Note (Addendum)
Patient apparently had a left heart catheterization 15-20 years ago.  He had a lesion posteriorly that could not be engaged. [facility] was [facility] He has had no angina. LVF normal 03/18/15

## 2015-04-29 NOTE — Patient Instructions (Signed)
Your physician recommends that you schedule a follow-up appointment in [place]   05/14/15  STOP PROCARDIA   START DILTIAZEM CD 120 MG ONE CAPSULE BY MOUTH DAILY.  START HCTZ 12.5 CAPSULE EVERY Monday -Wednesday - Friday  DECREASE SIMVASTATIN 80 MG TO 40 MG (1/2 TABLET DAILY)   LABS- BMP,CBC,MAGNESIUM  If you need a refill on your cardiac medications before your next appointment, please call your pharmacy.

## 2015-04-29 NOTE — Assessment & Plan Note (Signed)
Last SCr 1.32

## 2015-04-30 ENCOUNTER — Encounter: Payer: Self-pay | Admitting: Cardiology

## 2015-05-08 NOTE — Addendum Note (Signed)
Addended by: Ronnell GuadalajaraLASSITER, Annick Dimaio A on: 05/08/2015 08:24 AM   Modules accepted: Orders

## 2015-05-14 ENCOUNTER — Ambulatory Visit (INDEPENDENT_AMBULATORY_CARE_PROVIDER_SITE_OTHER): Payer: Medicare Other | Admitting: Cardiology

## 2015-05-14 ENCOUNTER — Encounter: Payer: Self-pay | Admitting: Cardiology

## 2015-05-14 VITALS — BP 134/72 | HR 68 | Ht 69.0 in | Wt 148.3 lb

## 2015-05-14 DIAGNOSIS — R55 Syncope and collapse: Secondary | ICD-10-CM

## 2015-05-14 DIAGNOSIS — I480 Paroxysmal atrial fibrillation: Secondary | ICD-10-CM | POA: Diagnosis not present

## 2015-05-14 DIAGNOSIS — I10 Essential (primary) hypertension: Secondary | ICD-10-CM | POA: Diagnosis not present

## 2015-05-14 DIAGNOSIS — I2510 Atherosclerotic heart disease of native coronary artery without angina pectoris: Secondary | ICD-10-CM

## 2015-05-14 DIAGNOSIS — E785 Hyperlipidemia, unspecified: Secondary | ICD-10-CM

## 2015-05-14 DIAGNOSIS — N183 Chronic kidney disease, stage 3 unspecified: Secondary | ICD-10-CM

## 2015-05-14 NOTE — Assessment & Plan Note (Signed)
Recurrent syncope and (last week) near syncope with micturition

## 2015-05-14 NOTE — Assessment & Plan Note (Signed)
GFR 40-50 

## 2015-05-14 NOTE — Assessment & Plan Note (Signed)
Controlled.  

## 2015-05-14 NOTE — Progress Notes (Signed)
05/14/2015 Vincent Cash Sr.   Apr 10, 1923  [id number]  Primary Physician Joycelyn Rua, MD Primary Cardiologist: Dr Jens Som (saw pt in consult Oct 2016 at [facility])  HPI:  81 year old male, whose wife passed away in 2023-03-27, with a history of coronary artery disease, hypothyroidism, hyperlipidemia, hypertension, BPH and arthritis. Patient had a left heart catheterization 15-20 years ago at [facility]. He had a lesion posteriorly that could not be engaged. He's had no issues with this since and has not seen a cardiologist since that time. He presented 03/18/15  with atrial fibrillation and syncopal episode. The syncope was felt to be secondary to micturition. He converted to NSR spontaneously. EF was 60-65%.  He was not placed on anticoagulation secondary to his history of syncope.           He was seen in f/u 04/29/15. He was back in AF with a rate of 100 and complaining of LE edema and DOE. His procardia was changed to Diltiazem and 3 x week HCTZ added. He is in the office today for follow up. He remains in AF though his rate is now 78. Overall he feels better. He is living with his son and daughter in law. He does not drive.    Current Outpatient Prescriptions  Medication Sig Dispense Refill  . aspirin EC 81 MG tablet Take by mouth.    . carvedilol (COREG) 6.25 MG tablet Take 6.25 mg by mouth 2 (two) times daily with a meal.     . diltiazem (CARDIZEM CD) 120 MG 24 hr capsule Take 1 capsule (120 mg total) by mouth daily. 30 capsule 6  . finasteride (PROSCAR) 5 MG tablet Take by mouth.    . hydrochlorothiazide (MICROZIDE) 12.5 MG capsule Take 1 capsule (12.5 mg total) by mouth every Monday, Wednesday, and Friday. 36 capsule 3  . isosorbide mononitrate (IMDUR) 60 MG 24 hr tablet Take by mouth.    . levothyroxine (SYNTHROID, LEVOTHROID) 150 MCG tablet Take by mouth.    . Melatonin 5 MG CAPS Take 5 mg by mouth at bedtime.    Marland Kitchen omeprazole (PRILOSEC) 20 MG capsule Take by mouth.      . polyethylene glycol (MIRALAX / GLYCOLAX) packet Take 17 g by mouth daily.    Marland Kitchen senna-docusate (SENOKOT-S) 8.6-50 MG tablet Take 1 tablet by mouth at bedtime as needed for mild constipation.    . simvastatin (ZOCOR) 80 MG tablet Take 0.5 tablets (40 mg total) by mouth daily at 6 PM. 90 tablet 0  . tamsulosin (FLOMAX) 0.4 MG CAPS capsule Take 1 capsule (0.4 mg total) by mouth daily. 30 capsule 12   No current facility-administered medications for this visit.    No Known Allergies  Social History   Social History  . Marital Status: Married    Spouse Name: N/A  . Number of Children: N/A  . Years of Education: N/A   Occupational History  . Not on file.   Social History Main Topics  . Smoking status: Former Games developer  . Smokeless tobacco: Not on file  . Alcohol Use: No  . Drug Use: No  . Sexual Activity: Not on file   Other Topics Concern  . Not on file   Social History Narrative     Review of Systems: General: negative for chills, fever, night sweats or weight changes.  Cardiovascular: negative for chest pain, dyspnea on exertion, edema, orthopnea, palpitations, paroxysmal nocturnal dyspnea or shortness of breath Dermatological: negative for rash Respiratory: negative for  cough or wheezing Urologic: negative for hematuria Abdominal: negative for nausea, vomiting, diarrhea, bright red blood per rectum, melena, or hematemesis Neurologic: negative for visual changes, syncope, or dizziness All other systems reviewed and are otherwise negative except as noted above.    Blood pressure 134/72, pulse 68, height 5\' 9"  (1.753 m), weight 148 lb 5 oz (67.274 kg).  General appearance: alert, cooperative, appears stated age and no distress Neck: no carotid bruit and no JVD Lungs: clear to auscultation bilaterally Heart: irregularly irregular rhythm Extremities: no edema Skin: Skin color, texture, turgor normal. No rashes or lesions Neurologic: Grossly normal  EKG AF with  CVR  ASSESSMENT AND PLAN:   Coronary artery disease Patient apparently had a left heart catheterization 15-20 years ago.  He had a lesion posteriorly that could not be engaged. This was done [facility]. No history of recent angina  Paroxysmal atrial fibrillation [facility](HCC) Recent PAF- Oct 2016. Not on anticoagulation secondary to history of micturition syncope.  Rate under better control on Diltiazem.   Syncope Recurrent syncope and (last week) near syncope with micturition   Hypertension Controlled  Hyperlipidemia On statin therapy  CKD (chronic kidney disease), stage III GFR 40-50   PLAN  Reviewed with Dr Jens Somrenshaw. The pt did relate an episode of near syncope last week while urinating. We are concerned about anticoagulation with this history. He'll f/u with Dr Jens Somrenshaw in 8 weeks. Risks and benifits of anticoagulation were discussed with the pt and daughter in law and they understand the issues and agree with current plan.   Corine ShelterKILROY,Vincent Mendez K PA-C 05/14/2015 4:31 PM

## 2015-05-14 NOTE — Assessment & Plan Note (Signed)
Patient apparently had a left heart catheterization 15-20 years ago.  He had a lesion posteriorly that could not be engaged. This was done [facility]. No history of recent angina

## 2015-05-14 NOTE — Patient Instructions (Signed)
Your physician recommends that you schedule a follow-up appointment in: 8 WEEKS WITH DR CRENSHAW  

## 2015-05-14 NOTE — Assessment & Plan Note (Addendum)
Recent PAF- Oct 2016. Not on anticoagulation secondary to history of micturition syncope.  Rate under better control on Diltiazem.

## 2015-05-14 NOTE — Assessment & Plan Note (Signed)
On statin therapy 

## 2015-06-24 ENCOUNTER — Telehealth: Payer: Self-pay | Admitting: Cardiology

## 2015-06-24 MED ORDER — ISOSORBIDE MONONITRATE ER 60 MG PO TB24: 60.0000 mg | ORAL_TABLET | Freq: Every day | ORAL | Status: DC

## 2015-06-24 NOTE — Telephone Encounter (Signed)
°*  STAT* If patient is at the pharmacy, call can be transferred to refill team.   1. Which medications need to be refilled? (please list name of each medication and dose if known) Isosorbide 60mg   2. Which pharmacy/location (including street and city if local pharmacy) is medication to be sent to? [facility] [place]   3. Do they need a 30 day or 90 day supply? 30

## 2015-07-11 NOTE — Progress Notes (Signed)
HPI: FU coronary artery disease, PAF. Patient had a left heart catheterization 15-20 years ago at [facility]. He presented 10/16 with atrial fibrillation and syncopal episode. The syncope was felt to be secondary to micturition. He converted to NSR spontaneously. EF was 60-65%. He was not placed on anticoagulation secondary to his history of syncope. He was seen in f/u 04/29/15. He was back in AF with a rate of 100 and complaining of LE edema and DOE. His procardia was changed to Diltiazem and 3 x week HCTZ added. Monitor in October 2016 showed paroxysmal atrial fibrillation. Not anticoagulated due to syncopal episodes. Since last seen He denies dyspnea, chest painPalpitations or syncope. He did fall 2 nights ago in his bathroom. He states he lost his balance.  Current Outpatient Prescriptions  Medication Sig Dispense Refill  . aspirin EC 81 MG tablet Take 81 mg by mouth daily.     . carvedilol (COREG) 6.25 MG tablet Take 6.25 mg by mouth 2 (two) times daily with a meal.     . diltiazem (CARDIZEM CD) 120 MG 24 hr capsule Take 1 capsule (120 mg total) by mouth daily. 30 capsule 6  . finasteride (PROSCAR) 5 MG tablet Take 5 mg by mouth daily.     . hydrochlorothiazide (MICROZIDE) 12.5 MG capsule Take 1 capsule (12.5 mg total) by mouth every Monday, Wednesday, and Friday. 36 capsule 3  . isosorbide mononitrate (IMDUR) 60 MG 24 hr tablet Take 1 tablet (60 mg total) by mouth daily. 30 tablet 1  . levothyroxine (SYNTHROID, LEVOTHROID) 150 MCG tablet Take 150 mcg by mouth daily before breakfast.     . Melatonin 5 MG CAPS Take 5 mg by mouth at bedtime.    Marland Kitchen omeprazole (PRILOSEC) 20 MG capsule Take 20 mg by mouth daily.     . polyethylene glycol (MIRALAX / GLYCOLAX) packet Take 17 g by mouth daily.    . simvastatin (ZOCOR) 80 MG tablet Take 0.5 tablets (40 mg total) by mouth daily at 6 PM. 90 tablet 0  . tamsulosin (FLOMAX) 0.4 MG CAPS capsule Take 1 capsule (0.4 mg total) by mouth daily.  30 capsule 12   No current facility-administered medications for this visit.     Past Medical History  Diagnosis Date  . Hypothyroid   . HLD (hyperlipidemia)   . HTN (hypertension)   . BPH (benign prostatic hyperplasia)   . Dysuria   . Arthritis   . CAD (coronary artery disease)   . Atrial fibrillation [facility])     Past Surgical History  Procedure Laterality Date  . Transurethral resection of prostate    . Collapsed lung      x 2  . Thyroid surgery      Social History   Social History  . Marital Status: Married    Spouse Name: N/A  . Number of Children: N/A  . Years of Education: N/A   Occupational History  . Not on file.   Social History Main Topics  . Smoking status: Former Games developer  . Smokeless tobacco: Former Neurosurgeon    Types: Chew  . Alcohol Use: No  . Drug Use: No  . Sexual Activity: Not on file   Other Topics Concern  . Not on file   Social History Narrative    Family History  Problem Relation Age of Onset  . Kidney disease Father   . Bladder Cancer Father     ROS: Arthralgias but no fevers or chills, productive cough,  hemoptysis, dysphasia, odynophagia, melena, hematochezia, dysuria, hematuria, rash, seizure activity, orthopnea, PND, pedal edema, claudication. Remaining systems are negative.  Physical Exam: Well-developed well-nourished in no acute distress.  Skin is warm and dry.  HEENT is normal.  Neck is supple.  Chest is clear to auscultation with normal expansion.  Cardiovascular exam is Irregular Abdominal exam nontender or distended. No masses palpated. Extremities show no edema. neuro grossly intact

## 2015-07-15 ENCOUNTER — Encounter: Payer: Self-pay | Admitting: Cardiology

## 2015-07-15 ENCOUNTER — Ambulatory Visit (INDEPENDENT_AMBULATORY_CARE_PROVIDER_SITE_OTHER): Payer: Medicare Other | Admitting: Cardiology

## 2015-07-15 DIAGNOSIS — I480 Paroxysmal atrial fibrillation: Secondary | ICD-10-CM | POA: Diagnosis not present

## 2015-07-15 DIAGNOSIS — I2510 Atherosclerotic heart disease of native coronary artery without angina pectoris: Secondary | ICD-10-CM

## 2015-07-15 DIAGNOSIS — E785 Hyperlipidemia, unspecified: Secondary | ICD-10-CM

## 2015-07-15 DIAGNOSIS — I2583 Coronary atherosclerosis due to lipid rich plaque: Secondary | ICD-10-CM

## 2015-07-15 DIAGNOSIS — I10 Essential (primary) hypertension: Secondary | ICD-10-CM

## 2015-07-15 MED ORDER — CARVEDILOL 6.25 MG PO TABS: 6.2500 mg | ORAL_TABLET | Freq: Two times a day (BID) | ORAL | Status: DC

## 2015-07-15 MED ORDER — HYDROCHLOROTHIAZIDE 12.5 MG PO CAPS: 12.5000 mg | ORAL_CAPSULE | ORAL | Status: DC

## 2015-07-15 MED ORDER — DILTIAZEM HCL ER COATED BEADS 120 MG PO CP24: 120.0000 mg | ORAL_CAPSULE | Freq: Every day | ORAL | Status: DC

## 2015-07-15 NOTE — Assessment & Plan Note (Signed)
Previous episode felt secondary to micturition. No further episodes.

## 2015-07-15 NOTE — Assessment & Plan Note (Signed)
Patient remains in atrial fibrillation today. Continue Cardizem for rate control. Long discussionConcerning the risks and benefits of anticoagulation. CHADSvasc 4. He would obviously benefit long-term from anticoagulation but he fell again recently. They feel the risk outweighs the benefit and at this point I agree. I will see him back in 3 months and we will reassess at that time

## 2015-07-15 NOTE — Assessment & Plan Note (Signed)
Continue statin. 

## 2015-07-15 NOTE — Assessment & Plan Note (Signed)
Blood pressure controlled. Continue present medications. 

## 2015-07-15 NOTE — Assessment & Plan Note (Signed)
Continue aspirin and statin. 

## 2015-07-15 NOTE — Patient Instructions (Signed)
Your physician recommends that you schedule a follow-up appointment in: 3 MONTHS WITH DR CRENSHAW  

## 2015-08-26 NOTE — Telephone Encounter (Signed)
Patient does not have a yearly follow up appointment scheduled at this time.  We last saw him in July 2016.  He can make an appointment for July or sooner if he is having problems.

## 2015-08-27 NOTE — Telephone Encounter (Signed)
Called to make appt and his daughter said that his health is not good and she will discuss things with his PCP and call me back to let me know if he needs to continue his care here or if his health is to bad and he will just see his PCP? I will let you know.   Thanks,  Marcelino DusterMichelle

## 2015-08-28 NOTE — Telephone Encounter (Signed)
Daughter called back today and said they are not going to follow up with us right now, that he is ok and is being seen by his PCP   MIchelle

## 2015-09-27 ENCOUNTER — Encounter (HOSPITAL_COMMUNITY): Payer: Self-pay | Admitting: Emergency Medicine

## 2015-09-27 ENCOUNTER — Emergency Department (HOSPITAL_COMMUNITY): Payer: Medicare Other

## 2015-09-27 ENCOUNTER — Emergency Department (HOSPITAL_COMMUNITY)
Admission: EM | Admit: 2015-09-27 | Discharge: 2015-09-27 | Disposition: A | Discharge status: Home / Self Care | Payer: Medicare Other | Attending: Emergency Medicine | Admitting: Emergency Medicine

## 2015-09-27 DIAGNOSIS — I2510 Atherosclerotic heart disease of native coronary artery without angina pectoris: Secondary | ICD-10-CM | POA: Insufficient documentation

## 2015-09-27 DIAGNOSIS — W1839XA Other fall on same level, initial encounter: Secondary | ICD-10-CM | POA: Insufficient documentation

## 2015-09-27 DIAGNOSIS — Z79899 Other long term (current) drug therapy: Secondary | ICD-10-CM | POA: Diagnosis not present

## 2015-09-27 DIAGNOSIS — Y998 Other external cause status: Secondary | ICD-10-CM | POA: Diagnosis not present

## 2015-09-27 DIAGNOSIS — W19XXXA Unspecified fall, initial encounter: Secondary | ICD-10-CM

## 2015-09-27 DIAGNOSIS — E785 Hyperlipidemia, unspecified: Secondary | ICD-10-CM | POA: Diagnosis not present

## 2015-09-27 DIAGNOSIS — S0083XA Contusion of other part of head, initial encounter: Secondary | ICD-10-CM | POA: Insufficient documentation

## 2015-09-27 DIAGNOSIS — S0001XA Abrasion of scalp, initial encounter: Secondary | ICD-10-CM | POA: Insufficient documentation

## 2015-09-27 DIAGNOSIS — Z7982 Long term (current) use of aspirin: Secondary | ICD-10-CM | POA: Insufficient documentation

## 2015-09-27 DIAGNOSIS — Y9289 Other specified places as the place of occurrence of the external cause: Secondary | ICD-10-CM | POA: Insufficient documentation

## 2015-09-27 DIAGNOSIS — E039 Hypothyroidism, unspecified: Secondary | ICD-10-CM | POA: Diagnosis not present

## 2015-09-27 DIAGNOSIS — I10 Essential (primary) hypertension: Secondary | ICD-10-CM | POA: Insufficient documentation

## 2015-09-27 DIAGNOSIS — Y9389 Activity, other specified: Secondary | ICD-10-CM | POA: Insufficient documentation

## 2015-09-27 DIAGNOSIS — S2242XA Multiple fractures of ribs, left side, initial encounter for closed fracture: Secondary | ICD-10-CM | POA: Insufficient documentation

## 2015-09-27 DIAGNOSIS — S2232XA Fracture of one rib, left side, initial encounter for closed fracture: Secondary | ICD-10-CM

## 2015-09-27 DIAGNOSIS — Z87891 Personal history of nicotine dependence: Secondary | ICD-10-CM | POA: Diagnosis not present

## 2015-09-27 DIAGNOSIS — S0093XA Contusion of unspecified part of head, initial encounter: Secondary | ICD-10-CM

## 2015-09-27 DIAGNOSIS — Z23 Encounter for immunization: Secondary | ICD-10-CM | POA: Insufficient documentation

## 2015-09-27 DIAGNOSIS — S29001A Unspecified injury of muscle and tendon of front wall of thorax, initial encounter: Secondary | ICD-10-CM | POA: Diagnosis present

## 2015-09-27 LAB — I-STAT CHEM 8, ED
BUN: 30 mg/dL — ABNORMAL HIGH (ref 6–20)
CREATININE: 1.7 mg/dL — AB (ref 0.61–1.24)
Calcium, Ion: 1.16 mmol/L (ref 1.13–1.30)
Chloride: 105 mmol/L (ref 101–111)
GLUCOSE: 116 mg/dL — AB (ref 65–99)
HEMATOCRIT: 43 % (ref 39.0–52.0)
HEMOGLOBIN: 14.6 g/dL (ref 13.0–17.0)
POTASSIUM: 4.2 mmol/L (ref 3.5–5.1)
Sodium: 142 mmol/L (ref 135–145)
TCO2: 24 mmol/L (ref 0–100)

## 2015-09-27 LAB — I-STAT TROPONIN, ED: TROPONIN I, POC: 0 ng/mL (ref 0.00–0.08)

## 2015-09-27 MED ORDER — OXYCODONE-ACETAMINOPHEN 5-325 MG PO TABS
2.0000 | ORAL_TABLET | Freq: Once | ORAL | Status: AC
  Administered 2015-09-27: 2 via ORAL
  Filled 2015-09-27: qty 2

## 2015-09-27 MED ORDER — TETANUS-DIPHTH-ACELL PERTUSSIS 5-2.5-18.5 LF-MCG/0.5 IM SUSP
0.5000 mL | Freq: Once | INTRAMUSCULAR | Status: AC
  Administered 2015-09-27: 0.5 mL via INTRAMUSCULAR
  Filled 2015-09-27: qty 0.5

## 2015-09-27 MED ORDER — OXYCODONE-ACETAMINOPHEN 5-325 MG PO TABS: 1.0000 | ORAL_TABLET | ORAL | Status: DC | PRN

## 2015-09-27 NOTE — ED Provider Notes (Signed)
CSN: [id number]     Arrival date & time 09/27/15  0551 History   First MD Initiated Contact with Patient 09/27/15 [phone]     Chief Complaint  Patient presents with  . Fall     (Consider location/radiation/quality/duration/timing/severity/associated sxs/prior Treatment) Patient is a 82 y.o. male presenting with fall.  Fall     This is a 82 year old man with a fall this morning complaining of left sided chest wall pain. He has had multiple falls beginning in November. He has had an evaluation for falls and it is felt to be secondary to micturition.  He also has paroxysmal a fib.  He states that this morning he got up and went to the bathroom. He was told that he needs to sit down because the bathroom. He states he is a difficult time going to the bathroom sitting down so he was standing. He then became somewhat lightheaded and fell to the ground. His daughter-in-law came in and found him and he was initially described of bone. He was then awake and alert. He struck his head and complains of pain in the left side of his chest. He states it is difficult to speak, breeze, and move. He denies any headache and was not aware that he had struck his head. He denies any vision changes, neck pain, abdominal pain, nausea, vomiting, recent illness, fever, chills, frequency of urination, diarrhea, or extremity injury. He has not had any lateralized neurological deficits.  Past Medical History  Diagnosis Date  . Hypothyroid   . HLD (hyperlipidemia)   . HTN (hypertension)   . BPH (benign prostatic hyperplasia)   . Dysuria   . Arthritis   . CAD (coronary artery disease)   . Atrial fibrillation [facility](HCC)    Past Surgical History  Procedure Laterality Date  . Transurethral resection of prostate    . Collapsed lung      x 2  . Thyroid surgery     Family History  Problem Relation Age of Onset  . Kidney disease Father   . Bladder Cancer Father    Social History  Substance Use Topics  . Smoking status:  Former Games developermoker  . Smokeless tobacco: Former NeurosurgeonUser    Types: Chew  . Alcohol Use: No    Review of Systems  All other systems reviewed and are negative.     Allergies  Review of patient's allergies indicates no known allergies.  Home Medications   Prior to Admission medications   Medication Sig Start Date End Date Taking? Authorizing Provider  aspirin EC 81 MG tablet Take 81 mg by mouth daily.    Yes Historical Provider, MD  carvedilol (COREG) 6.25 MG tablet Take 1 tablet (6.25 mg total) by mouth 2 (two) times daily with a meal. 07/15/15 07/14/16 Yes Lewayne BuntingBrian S Crenshaw, MD  diltiazem (CARDIZEM CD) 120 MG 24 hr capsule Take 1 capsule (120 mg total) by mouth daily. 07/15/15  Yes Lewayne BuntingBrian S Crenshaw, MD  finasteride (PROSCAR) 5 MG tablet Take 5 mg by mouth daily.    Yes Historical Provider, MD  hydrochlorothiazide (MICROZIDE) 12.5 MG capsule Take 1 capsule (12.5 mg total) by mouth every Monday, Wednesday, and Friday. 07/15/15  Yes Lewayne BuntingBrian S Crenshaw, MD  isosorbide mononitrate (IMDUR) 60 MG 24 hr tablet Take 1 tablet (60 mg total) by mouth daily. 06/24/15  Yes Lewayne BuntingBrian S Crenshaw, MD  levothyroxine (SYNTHROID, LEVOTHROID) 150 MCG tablet Take 150 mcg by mouth daily before breakfast.  01/07/15  Yes Historical Provider, MD  Melatonin  5 MG CAPS Take 5 mg by mouth at bedtime as needed (sleep).    Yes Historical Provider, MD  omeprazole (PRILOSEC) 20 MG capsule Take 20 mg by mouth daily.  08/20/14  Yes Historical Provider, MD  polyethylene glycol (MIRALAX / GLYCOLAX) packet Take 17 g by mouth daily as needed for mild constipation.    Yes Historical Provider, MD  simvastatin (ZOCOR) 80 MG tablet Take 0.5 tablets (40 mg total) by mouth daily at 6 PM. 04/29/15  Yes Abelino Derrick, PA-C  tamsulosin (FLOMAX) 0.4 MG CAPS capsule Take 1 capsule (0.4 mg total) by mouth daily. 01/11/15  Yes Shannon A McGowan, PA-C   BP 122/95 mmHg  Pulse 74  Resp 12  Ht  (1.727 m)  Wt 68.04 kg  BMI 22.81 kg/m2  SpO2 95% Physical  Exam  Constitutional: He is oriented to person, place, and time. He appears well-developed and well-nourished. No distress.  HENT:  Right Ear: External ear normal.  Left Ear: External ear normal.  Nose: Nose normal.  Mouth/Throat: Oropharynx is clear and moist.  Abrasion right scalp  Eyes: Conjunctivae and EOM are normal. Pupils are equal, round, and reactive to light.  Neck: Normal range of motion. Neck supple. No JVD present. No tracheal deviation present. No thyromegaly present.  Cardiovascular: An irregularly irregular rhythm present.  Pulmonary/Chest: Effort normal and breath sounds normal. He exhibits tenderness.    Abdominal: Soft. Bowel sounds are normal.  Musculoskeletal: Normal range of motion. He exhibits no edema or tenderness.  Neurological: He is alert and oriented to person, place, and time. He has normal reflexes. He displays normal reflexes. No cranial nerve deficit. He exhibits normal muscle tone. Coordination normal.  Skin: Skin is warm and dry.  Psychiatric: He has a normal mood and affect.  Nursing note and vitals reviewed.   ED Course  Procedures (including critical care time) Labs Review Labs Reviewed  I-STAT CHEM 8, ED - Abnormal; Notable for the following:    BUN 30 (*)    Creatinine, Ser 1.70 (*)    Glucose, Bld 116 (*)    All other components within normal limits  I-STAT TROPOININ, ED    Imaging Review Dg Ribs Unilateral W/chest Left  09/27/2015  CLINICAL DATA:  Fall with posterior left rib pain. EXAM: LEFT RIBS AND CHEST - 3+ VIEW COMPARISON:  03/18/2015 FINDINGS: Initial encounter. Posterior left ninth rib fracture which is nondisplaced. Lateral eighth and ninth rib fractures with moderate ninth rib displacement. No pneumothorax, hemothorax, or lung contusion. Normal heart size and stable aortic contours. IMPRESSION: Left eighth and ninth rib fractures, segmental and displaced at the ninth rib. No evidence of intrathoracic injury. Electronically Signed    By: Marnee Spring M.D.   On: 09/27/2015 07:21   Ct Head Wo Contrast  09/27/2015  CLINICAL DATA:  Got dizzy when he went to use the bathroom, syncope, abrasion top RIGHT-side of head, on aspirin, history atrial fibrillation, hypertension, coronary artery disease EXAM: CT HEAD WITHOUT CONTRAST CT CERVICAL SPINE WITHOUT CONTRAST TECHNIQUE: Multidetector CT imaging of the head and cervical spine was performed following the standard protocol without intravenous contrast. Multiplanar CT image reconstructions of the cervical spine were also generated. COMPARISON:  None FINDINGS: CT HEAD FINDINGS Generalized atrophy. Normal ventricular morphology. No midline shift or mass effect. Small vessel chronic ischemic changes of deep cerebral white matter. Old appearing lacunar infarcts at external capsules bilaterally. No intracranial hemorrhage, mass lesion, or acute infarction. Visualized paranasal sinuses and mastoid air  cells clear. Bones unremarkable. CT CERVICAL SPINE FINDINGS Prevertebral soft tissues normal thickness. Bones demineralized. Multilevel disc space narrowing and endplate spur formation. Vertebral body and disc space heights maintained. Multilevel facet degenerative changes. Narrowing of multiple neural foramina bilaterally by uncovertebral and facet hypertrophy. No acute fracture, subluxation or bone destruction. Atherosclerotic calcifications of the carotid systems bilaterally. Lung apices clear. IMPRESSION: Atrophy with small vessel chronic ischemic changes of deep cerebral white matter. No acute intracranial abnormalities. 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 No acute cervical spine abnormalities. Electronically Signed   By: Ulyses Southward M.D.   On: 09/27/2015 08:21   Ct Cervical Spine Wo Contrast  09/27/2015  CLINICAL DATA:  Got dizzy when he went to use the bathroom, syncope, abrasion top RIGHT-side of head, on aspirin, history atrial fibrillation, hypertension, coronary artery  disease EXAM: CT HEAD WITHOUT CONTRAST CT CERVICAL SPINE WITHOUT CONTRAST TECHNIQUE: Multidetector CT imaging of the head and cervical spine was performed following the standard protocol without intravenous contrast. Multiplanar CT image reconstructions of the cervical spine were also generated. COMPARISON:  None FINDINGS: CT HEAD FINDINGS Generalized atrophy. Normal ventricular morphology. No midline shift or mass effect. Small vessel chronic ischemic changes of deep cerebral white matter. Old appearing lacunar infarcts at external capsules bilaterally. No intracranial hemorrhage, mass lesion, or acute infarction. Visualized paranasal sinuses and mastoid air cells clear. Bones unremarkable. CT CERVICAL SPINE FINDINGS Prevertebral soft tissues normal thickness. Bones demineralized. Multilevel disc space narrowing and endplate spur formation. Vertebral body and disc space heights maintained. Multilevel facet degenerative changes. Narrowing of multiple neural foramina bilaterally by uncovertebral and facet hypertrophy. No acute fracture, subluxation or bone destruction. Atherosclerotic calcifications of the carotid systems bilaterally. Lung apices clear. IMPRESSION: Atrophy with small vessel chronic ischemic changes of deep cerebral white matter. No acute intracranial abnormalities. 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 No acute cervical spine abnormalities. Electronically Signed   By: Ulyses Southward M.D.   On: 09/27/2015 08:21   I have personally reviewed and evaluated these images and lab results as part of my medical decision-making.   EKG Interpretation   Date/Time:  Friday September 27 2015 06:03:36 EDT Ventricular Rate:  62 PR Interval:    QRS Duration: 75 QT Interval:  344 QTC Calculation: 349 R Axis:   65 Text Interpretation:  Atrial fibrillation Borderline low voltage,  extremity leads No significant change since last tracing Confirmed by Maeli Spacek  MD, Duwayne Heck [phone]) on 09/27/2015  7:08:30 AM      MDM   Final diagnoses:  Rib fracture, left, closed, initial encounter  Head contusion, initial encounter  Fall, initial encounter      Fall with known history of micturition syncope which occurred when urinating.  Patient with a fib with known a fib- rate controlled.  Head ct, neck ct- no evidence of acute abnormality.  CXR with left 8 and 9th rib fracture- patient treated here with pain medicine.  IS given and patient ambulated.   Patient ambulated without difficulty here. He feels much improved after 2 Percocet by mouth. I have discussed with the patient, his son, and daughter-in-law the need for close follow-up, pushing oral fluids, recheck of his creatinine with his primary care doctor, and recheck of his breathing status with his primary care doctor. We discussed that he can return here if there are any problems especially as we are going into the Easter weekend. We discussed the need for recheck of his creatinine. They voice understanding. We have also  discussed the patient's step down from the Percocet to Tylenol as quickly as possible. Discussed the side effects of the Percocet and need to keep a close eye on him and avoid any further falls or sedation.  Margarita Grizzle, MD 10/01/15 [phone]

## 2015-09-27 NOTE — ED Notes (Signed)
Patient instructed on how to use incentive spirometer, patient demonstrated understanding of use of incentive spirometer to this RN.

## 2015-09-27 NOTE — ED Notes (Signed)
Pt coming from home. Pt fell this morning after urinating. PT unable to recall if he lost LOC or not. Pt recalls feeling dizzy, weak, and then falling. Pt c/o left sided rib pain, worse with inspriation, cough and talking. Contusion and abraisions noted to R side of head. Pt has a history of falls with the same scenario as tonight. Approx 4x in the last 8 months. Pt rates pain 8/10.  Pt has a history of A fib. Pt takes daily ASA. Stroke screen negative per EMS. Lungs clear.    136/84 BP 98% RA 70s HR 153CBG 16R

## 2015-09-27 NOTE — ED Notes (Signed)
Patient transported to CT 

## 2015-09-27 NOTE — ED Notes (Signed)
MD at bedside. 

## 2015-09-27 NOTE — Discharge Instructions (Signed)
Contusion A contusion is a deep bruise. Contusions happen when an injury causes bleeding under the skin. Symptoms of bruising include pain, swelling, and discolored skin. The skin may turn blue, purple, or yellow. HOME CARE   Rest the injured area.  If told, put ice on the injured area.  Put ice in a plastic bag.  Place a towel between your skin and the bag.  Leave the ice on for 20 minutes, 2-3 times per day.  If told, put light pressure (compression) on the injured area using an elastic bandage. Make sure the bandage is not too tight. Remove it and put it back on as told by your doctor.  If possible, raise (elevate) the injured area above the level of your heart while you are sitting or lying down.  Take over-the-counter and prescription medicines only as told by your doctor. GET HELP IF:  Your symptoms do not get better after several days of treatment.  Your symptoms get worse.  You have trouble moving the injured area. GET HELP RIGHT AWAY IF:   You have very bad pain.  You have a loss of feeling (numbness) in a hand or foot.  Your hand or foot turns pale or cold.   This information is not intended to replace advice given to you by your health care provider. Make sure you discuss any questions you have with your health care provider.   Document Released: 11/18/2007 Document Revised: 02/20/2015 Document Reviewed: 10/17/2014 Elsevier Interactive Patient Education 2016 Elsevier Inc. Rib Fracture A rib fracture is a break or crack in one of the bones of the ribs. The ribs are like a cage that goes around your upper chest. A broken or cracked rib is often painful, but most do not cause other problems. Most rib fractures heal on their own in 1-3 months. HOME CARE  Avoid activities that cause pain to the injured area. Protect your injured area.  Slowly increase activity as told by your doctor.  Take medicine as told by your doctor.  Put ice on the injured area for the  first 1-2 days after you have been treated or as told by your doctor.  Put ice in a plastic bag.  Place a towel between your skin and the bag.  Leave the ice on for 15-20 minutes at a time, every 2 hours while you are awake.  Do deep breathing as told by your doctor. You may be told to:  Take deep breaths many times a day.  Cough many times a day while hugging a pillow.  Use a device (incentive spirometer) to perform deep breathing many times a day.  Drink enough fluids to keep your pee (urine) clear or pale yellow.   Do not wear a rib belt or binder. These do not allow you to breathe deeply. GET HELP RIGHT AWAY IF:   You have a fever.  You have trouble breathing.   You cannot stop coughing.  You cough up thick or bloody spit (mucus).   You feel sick to your stomach (nauseous), throw up (vomit), or have belly (abdominal) pain.   Your pain gets worse and medicine does not help.  MAKE SURE YOU:   Understand these instructions.  Will watch your condition.  Will get help right away if you are not doing well or get worse.   This information is not intended to replace advice given to you by your health care provider. Make sure you discuss any questions you have with your health  care provider.   Document Released: 03/10/2008 Document Revised: 09/26/2012 Document Reviewed: 08/03/2012 Elsevier Interactive Patient Education [employer]2016 Elsevier Inc.

## 2015-09-27 NOTE — ED Notes (Signed)
Patient ambulated in hall with Vincent Mendez, EDT and this RN. Pt tolerated ambulation well and appeared steady on his feet.

## 2015-10-10 NOTE — Progress Notes (Signed)
HPI: FU coronary artery disease, PAF. Patient had a left heart catheterization 15-20 years ago at [facility]. He presented 10/16 with atrial fibrillation and syncopal episode. The syncope was felt to be secondary to micturition. He converted to NSR spontaneously. EF was 60-65%. He was not placed on anticoagulation secondary to his history of syncope. He was seen in f/u 04/29/15. He was back in AF with a rate of 100 and complaining of LE edema and DOE. His procardia was changed to Diltiazem and 3 x week HCTZ added. Monitor in October 2016 showed paroxysmal atrial fibrillation. Not anticoagulated due to syncopal episodes. Seen in the emergency room in April with recurrent syncope while urinating. Since last seen He denies dyspnea, chest pain or palpitations. His episode of syncope occurred at night. He had just finished urinating and had frank syncope. No associated palpitations, dyspnea, nausea or chest pain.  Current Outpatient Prescriptions  Medication Sig Dispense Refill  . aspirin EC 81 MG tablet Take 81 mg by mouth daily.     . carvedilol (COREG) 6.25 MG tablet Take 1 tablet (6.25 mg total) by mouth 2 (two) times daily with a meal. 180 tablet 3  . diltiazem (CARDIZEM CD) 120 MG 24 hr capsule Take 1 capsule (120 mg total) by mouth daily. 90 capsule 3  . finasteride (PROSCAR) 5 MG tablet Take 5 mg by mouth daily.     . hydrochlorothiazide (MICROZIDE) 12.5 MG capsule Take 1 capsule (12.5 mg total) by mouth every Monday, Wednesday, and Friday. 36 capsule 3  . isosorbide mononitrate (IMDUR) 60 MG 24 hr tablet Take 1 tablet (60 mg total) by mouth daily. 30 tablet 1  . levothyroxine (SYNTHROID, LEVOTHROID) 150 MCG tablet Take 150 mcg by mouth daily before breakfast.     . lubiprostone (AMITIZA) 8 MCG capsule Take 8 mcg by mouth 2 (two) times daily with a meal.    . omeprazole (PRILOSEC) 20 MG capsule Take 20 mg by mouth daily.     . simvastatin (ZOCOR) 80 MG tablet Take 0.5 tablets (40  mg total) by mouth daily at 6 PM. 90 tablet 0  . tamsulosin (FLOMAX) 0.4 MG CAPS capsule Take 1 capsule (0.4 mg total) by mouth daily. 30 capsule 12   No current facility-administered medications for this visit.     Past Medical History  Diagnosis Date  . Hypothyroid   . HLD (hyperlipidemia)   . HTN (hypertension)   . BPH (benign prostatic hyperplasia)   . Dysuria   . Arthritis   . CAD (coronary artery disease)   . Atrial fibrillation [facility])     Past Surgical History  Procedure Laterality Date  . Transurethral resection of prostate    . Collapsed lung      x 2  . Thyroid surgery      Social History   Social History  . Marital Status: Married    Spouse Name: N/A  . Number of Children: N/A  . Years of Education: N/A   Occupational History  . Not on file.   Social History Main Topics  . Smoking status: Former Games developer  . Smokeless tobacco: Former Neurosurgeon    Types: Chew  . Alcohol Use: No  . Drug Use: No  . Sexual Activity: Not on file   Other Topics Concern  . Not on file   Social History Narrative    Family History  Problem Relation Age of Onset  . Kidney disease Father   . Bladder Cancer  Father     ROS: no fevers or chills, productive cough, hemoptysis, dysphasia, odynophagia, melena, hematochezia, dysuria, hematuria, rash, seizure activity, orthopnea, PND, pedal edema, claudication. Remaining systems are negative.  Physical Exam: Well-developed well-nourished in no acute distress.  Skin is warm and dry.  HEENT is normal.  Neck is supple.  Chest is clear to auscultation with normal expansion.  Cardiovascular exam is regular rate and rhythm.  Abdominal exam nontender or distended. No masses palpated. Extremities show no edema. neuro grossly intact

## 2015-10-11 ENCOUNTER — Encounter: Payer: Self-pay | Admitting: Cardiology

## 2015-10-11 ENCOUNTER — Ambulatory Visit (INDEPENDENT_AMBULATORY_CARE_PROVIDER_SITE_OTHER): Payer: Medicare Other | Admitting: Cardiology

## 2015-10-11 VITALS — BP 150/90 | HR 74 | Ht 68.0 in | Wt 154.0 lb

## 2015-10-11 DIAGNOSIS — I10 Essential (primary) hypertension: Secondary | ICD-10-CM

## 2015-10-11 DIAGNOSIS — I2583 Coronary atherosclerosis due to lipid rich plaque: Secondary | ICD-10-CM

## 2015-10-11 DIAGNOSIS — I480 Paroxysmal atrial fibrillation: Secondary | ICD-10-CM

## 2015-10-11 DIAGNOSIS — E785 Hyperlipidemia, unspecified: Secondary | ICD-10-CM | POA: Diagnosis not present

## 2015-10-11 DIAGNOSIS — I2510 Atherosclerotic heart disease of native coronary artery without angina pectoris: Secondary | ICD-10-CM | POA: Diagnosis not present

## 2015-10-11 NOTE — Assessment & Plan Note (Signed)
Continue aspirin and statin. 

## 2015-10-11 NOTE — Assessment & Plan Note (Signed)
Patient remains in atrial fibrillation on examination. Continue present medications for rate control. Continue aspirin. Given recurrent syncope I do not think he is a candidate for anticoagulation. They understand the higher risk of stroke but at this point I think risk outweighs benefit.

## 2015-10-11 NOTE — Assessment & Plan Note (Signed)
Patient likely hasMicturition syncope. He will try and urinate sitting down. If he has more frequent episodes of dizziness in the future or recurrent syncope without urinating we will plan a monitor to exclude bradycardia arrhythmias.

## 2015-10-11 NOTE — Assessment & Plan Note (Signed)
Continue statin. 

## 2015-10-11 NOTE — Patient Instructions (Signed)
Your physician wants you to follow-up in:  6 months. You will receive a reminder letter in the mail two months in advance. If you don't receive a letter, please call our office to schedule the follow-up appointment.   

## 2015-10-11 NOTE — Assessment & Plan Note (Signed)
Blood pressure controlled. Continue present medications. 

## 2016-03-25 ENCOUNTER — Encounter: Payer: Self-pay | Admitting: Cardiology

## 2016-04-06 NOTE — Progress Notes (Signed)
HPI: FU coronary artery disease, PAF. Patient had a left heart catheterization 15-20 years ago at [facility]. He presented 10/16 with atrial fibrillation and syncopal episode. The syncope was felt to be secondary to micturition. He converted to NSR spontaneously. EF was 60-65%. He was not placed on anticoagulation secondary to his history of syncope. He was seen in f/u 04/29/15. He was back in AF with a rate of 100 and complaining of LE edema and DOE. His procardia was changed to Diltiazem and 3 x week HCTZ added. Monitor in October 2016 showed paroxysmal atrial fibrillation. Not anticoagulated due to syncopal episodes. Seen in the emergency room in April 2017 with recurrent syncope while urinating. Since last seen he has mild dyspnea on exertion but there is no orthopnea, PND, pedal edema, chest pain, syncope or bleeding. He does not fall.  Current Outpatient Prescriptions  Medication Sig Dispense Refill  . aspirin EC 81 MG tablet Take 81 mg by mouth daily.     . carvedilol (COREG) 6.25 MG tablet Take 1 tablet (6.25 mg total) by mouth 2 (two) times daily with a meal. 180 tablet 3  . diltiazem (CARDIZEM CD) 120 MG 24 hr capsule Take 1 capsule (120 mg total) by mouth daily. 90 capsule 3  . finasteride (PROSCAR) 5 MG tablet Take 5 mg by mouth daily.     . hydrochlorothiazide (MICROZIDE) 12.5 MG capsule Take 1 capsule (12.5 mg total) by mouth every Monday, Wednesday, and Friday. 36 capsule 3  . isosorbide mononitrate (IMDUR) 60 MG 24 hr tablet Take 1 tablet (60 mg total) by mouth daily. 30 tablet 1  . levothyroxine (SYNTHROID, LEVOTHROID) 150 MCG tablet Take 150 mcg by mouth daily before breakfast.     . lubiprostone (AMITIZA) 8 MCG capsule Take 8 mcg by mouth 2 (two) times daily with a meal.    . omeprazole (PRILOSEC) 20 MG capsule Take 20 mg by mouth daily.     . simvastatin (ZOCOR) 80 MG tablet Take 0.5 tablets (40 mg total) by mouth daily at 6 PM. 90 tablet 0  . tamsulosin (FLOMAX)  0.4 MG CAPS capsule Take 1 capsule (0.4 mg total) by mouth daily. 30 capsule 12   No current facility-administered medications for this visit.      Past Medical History:  Diagnosis Date  . Arthritis   . Atrial fibrillation (HCC)   . BPH (benign prostatic hyperplasia)   . CAD (coronary artery disease)   . Dysuria   . HLD (hyperlipidemia)   . HTN (hypertension)   . Hypothyroid     Past Surgical History:  Procedure Laterality Date  . Collapsed Lung     x 2  . THYROID SURGERY    . TRANSURETHRAL RESECTION OF PROSTATE      Social History   Social History  . Marital status: Married    Spouse name: N/A  . Number of children: N/A  . Years of education: N/A   Occupational History  . Not on file.   Social History Main Topics  . Smoking status: Former Games developermoker  . Smokeless tobacco: Former NeurosurgeonUser    Types: Chew  . Alcohol use No  . Drug use: No  . Sexual activity: Not on file   Other Topics Concern  . Not on file   Social History Narrative  . No narrative on file    Family History  Problem Relation Age of Onset  . Kidney disease Father   . Bladder Cancer Father  ROS: no fevers or chills, productive cough, hemoptysis, dysphasia, odynophagia, melena, hematochezia, dysuria, hematuria, rash, seizure activity, orthopnea, PND, pedal edema, claudication. Remaining systems are negative.  Physical Exam: Well-developed well-nourished in no acute distress.  Skin is warm and dry.  HEENT is normal.  Neck is supple.  Chest is clear to auscultation with normal expansion.  Cardiovascular exam is irregular Abdominal exam nontender or distended. No masses palpated. Extremities show no edema. neuro grossly intact  ECG-Atrial fibrillation at a rate of 62. No ST changes.  A/P  1 syncope-patient with history of micturition syncope. No recent episodes.  2 Paroxysmal atrial fibrillation-patient remains in atrial fibrillation today. Continue Coreg and Cardizem for rate control.  I have previously not anticoagulated patient because of history of syncope. However this occurred with micturition and he now sits when he urinates. He has had no recurrent episodes and does not fall. No history of bleeding. I will therefore discontinue aspirin. Check renal function. Based on results we will begin apixaban. Check renal function and hemoglobin 4 weeks later. I will see him back in 3 months to make sure that he is doing well.  3 hypertension-blood pressure controlled. Continue present medications.  4 hyperlipidemia-continue statin.  5 coronary artery disease-continue aspirin and statin.  Olga Millers, MD

## 2016-04-07 ENCOUNTER — Ambulatory Visit (INDEPENDENT_AMBULATORY_CARE_PROVIDER_SITE_OTHER): Payer: Medicare Other | Admitting: Cardiology

## 2016-04-07 ENCOUNTER — Encounter: Payer: Self-pay | Admitting: Cardiology

## 2016-04-07 VITALS — BP 130/78 | HR 62 | Ht 68.0 in | Wt 159.4 lb

## 2016-04-07 DIAGNOSIS — Z23 Encounter for immunization: Secondary | ICD-10-CM

## 2016-04-07 DIAGNOSIS — I480 Paroxysmal atrial fibrillation: Secondary | ICD-10-CM | POA: Diagnosis not present

## 2016-04-07 DIAGNOSIS — I10 Essential (primary) hypertension: Secondary | ICD-10-CM | POA: Diagnosis not present

## 2016-04-07 DIAGNOSIS — E7800 Pure hypercholesterolemia, unspecified: Secondary | ICD-10-CM | POA: Diagnosis not present

## 2016-04-07 LAB — BASIC METABOLIC PANEL
BUN: 23 mg/dL (ref 7–25)
CO2: 27 mmol/L (ref 20–31)
CREATININE: 1.5 mg/dL — AB (ref 0.70–1.11)
Calcium: 9.4 mg/dL (ref 8.6–10.3)
Chloride: 104 mmol/L (ref 98–110)
Glucose, Bld: 109 mg/dL — ABNORMAL HIGH (ref 65–99)
Potassium: 4.9 mmol/L (ref 3.5–5.3)
Sodium: 139 mmol/L (ref 135–146)

## 2016-04-07 MED ORDER — HYDROCHLOROTHIAZIDE 12.5 MG PO CAPS: 12.5000 mg | ORAL_CAPSULE | ORAL | 3 refills | Status: DC

## 2016-04-07 MED ORDER — SIMVASTATIN 80 MG PO TABS: 40.0000 mg | ORAL_TABLET | Freq: Every day | ORAL | 3 refills | Status: DC

## 2016-04-07 MED ORDER — CARVEDILOL 6.25 MG PO TABS: 6.2500 mg | ORAL_TABLET | Freq: Two times a day (BID) | ORAL | 3 refills | Status: DC

## 2016-04-07 MED ORDER — DILTIAZEM HCL ER COATED BEADS 120 MG PO CP24: 120.0000 mg | ORAL_CAPSULE | Freq: Every day | ORAL | 3 refills | Status: DC

## 2016-04-07 MED ORDER — ISOSORBIDE MONONITRATE ER 60 MG PO TB24: 60.0000 mg | ORAL_TABLET | Freq: Every day | ORAL | 3 refills | Status: DC

## 2016-04-07 NOTE — Patient Instructions (Signed)
Medication Instructions:  STOP- Aspirin  Labwork: BMP today  Testing/Procedures: None Ordered  Follow-Up: Your physician recommends that you schedule a follow-up appointment in:  3 Months.   Any Other Special Instructions Will Be Listed Below (If Applicable).   If you need a refill on your cardiac medications before your next appointment, please call your pharmacy.

## 2016-04-08 ENCOUNTER — Telehealth: Payer: Self-pay | Admitting: *Deleted

## 2016-04-08 DIAGNOSIS — I4891 Unspecified atrial fibrillation: Secondary | ICD-10-CM

## 2016-04-08 MED ORDER — APIXABAN 2.5 MG PO TABS: 2.5000 mg | ORAL_TABLET | Freq: Two times a day (BID) | ORAL | 6 refills | Status: DC

## 2016-04-08 NOTE — Telephone Encounter (Signed)
Daughter in [place] is returning your call [phone]

## 2016-04-08 NOTE — Telephone Encounter (Signed)
Left message for becky to call

## 2016-04-08 NOTE — Telephone Encounter (Signed)
Spoke with becky, she voiced understanding of medication changes. New script sent to the pharmacy savings card and lab orders mailed to the patient.

## 2016-04-08 NOTE — Telephone Encounter (Signed)
-----   Message from Lewayne BuntingBrian S Crenshaw, MD sent at 04/08/2016  5:32 AM EDT ----- Dc asa, apixaban 2.5 bid, cbc and bmet 4 weeks Olga MillersBrian Crenshaw

## 2016-05-13 ENCOUNTER — Telehealth: Payer: Self-pay | Admitting: *Deleted

## 2016-05-13 LAB — CBC
HCT: 43.7 % (ref 38.5–50.0)
HEMOGLOBIN: 14.7 g/dL (ref 13.2–17.1)
MCH: 31.5 pg (ref 27.0–33.0)
MCHC: 33.6 g/dL (ref 32.0–36.0)
MCV: 93.6 fL (ref 80.0–100.0)
MPV: 9.5 fL (ref 7.5–12.5)
Platelets: 188 10*3/uL (ref 140–400)
RBC: 4.67 MIL/uL (ref 4.20–5.80)
RDW: 14.6 % (ref 11.0–15.0)
WBC: 8.9 10*3/uL (ref 3.8–10.8)

## 2016-05-13 LAB — BASIC METABOLIC PANEL
BUN: 24 mg/dL (ref 7–25)
CHLORIDE: 100 mmol/L (ref 98–110)
CO2: 26 mmol/L (ref 20–31)
Calcium: 9.7 mg/dL (ref 8.6–10.3)
Creat: 1.34 mg/dL — ABNORMAL HIGH (ref 0.70–1.11)
GLUCOSE: 99 mg/dL (ref 65–99)
POTASSIUM: 4.8 mmol/L (ref 3.5–5.3)
Sodium: 136 mmol/L (ref 135–146)

## 2016-05-13 MED ORDER — APIXABAN 5 MG PO TABS: 5.0000 mg | ORAL_TABLET | Freq: Two times a day (BID) | ORAL | 3 refills | Status: DC

## 2016-05-13 NOTE — Telephone Encounter (Signed)
Returning your call. °

## 2016-05-13 NOTE — Telephone Encounter (Signed)
Left message for pt to call.

## 2016-05-13 NOTE — Telephone Encounter (Signed)
-----   Message from Lewayne BuntingBrian S Crenshaw, MD sent at 05/13/2016  7:31 AM EST ----- Change apixaban to 5 mg BID Olga MillersBrian Crenshaw

## 2016-05-13 NOTE — Telephone Encounter (Signed)
Spoke with pt dtr-n-law, she voiced understanding of medication change. New script sent to the pharmacy

## 2016-07-01 NOTE — Progress Notes (Deleted)
HPI: FU coronary artery disease, PAF. Patient had a left heart catheterization 15-20 years ago at [facility]. He presented 10/16 with atrial fibrillation and syncopal episode. The syncope was felt to be secondary to micturition. He converted to NSR spontaneously. EF was 60-65%. He was not placed on anticoagulation secondary to his history of syncope. He was seen in f/u 04/29/15. He was back in AF with a rate of 100 and complaining of LE edema and DOE. His procardia was changed to Diltiazem and 3 x week HCTZ added. Monitor in October 2016 showed paroxysmal atrial fibrillation. Anticoagulation initiated at last ov. Since last seen   Current Outpatient Prescriptions  Medication Sig Dispense Refill  . apixaban (ELIQUIS) 5 MG TABS tablet Take 1 tablet (5 mg total) by mouth 2 (two) times daily. 180 tablet 3  . carvedilol (COREG) 6.25 MG tablet Take 1 tablet (6.25 mg total) by mouth 2 (two) times daily with a meal. 180 tablet 3  . diltiazem (CARDIZEM CD) 120 MG 24 hr capsule Take 1 capsule (120 mg total) by mouth daily. 90 capsule 3  . finasteride (PROSCAR) 5 MG tablet Take 5 mg by mouth daily.     . hydrochlorothiazide (MICROZIDE) 12.5 MG capsule Take 1 capsule (12.5 mg total) by mouth every Monday, Wednesday, and Friday. 108 capsule 3  . isosorbide mononitrate (IMDUR) 60 MG 24 hr tablet Take 1 tablet (60 mg total) by mouth daily. 90 tablet 3  . levothyroxine (SYNTHROID, LEVOTHROID) 150 MCG tablet Take 150 mcg by mouth daily before breakfast.     . lubiprostone (AMITIZA) 8 MCG capsule Take 8 mcg by mouth 2 (two) times daily with a meal.    . omeprazole (PRILOSEC) 20 MG capsule Take 20 mg by mouth daily.     . simvastatin (ZOCOR) 80 MG tablet Take 0.5 tablets (40 mg total) by mouth daily at 6 PM. 90 tablet 3  . tamsulosin (FLOMAX) 0.4 MG CAPS capsule Take 1 capsule (0.4 mg total) by mouth daily. 30 capsule 12   No current facility-administered medications for this visit.      Past  Medical History:  Diagnosis Date  . Arthritis   . Atrial fibrillation (HCC)   . BPH (benign prostatic hyperplasia)   . CAD (coronary artery disease)   . Dysuria   . HLD (hyperlipidemia)   . HTN (hypertension)   . Hypothyroid     Past Surgical History:  Procedure Laterality Date  . Collapsed Lung     x 2  . THYROID SURGERY    . TRANSURETHRAL RESECTION OF PROSTATE      Social History   Social History  . Marital status: Married    Spouse name: N/A  . Number of children: N/A  . Years of education: N/A   Occupational History  . Not on file.   Social History Main Topics  . Smoking status: Former Games developermoker  . Smokeless tobacco: Former NeurosurgeonUser    Types: Chew  . Alcohol use No  . Drug use: No  . Sexual activity: Not on file   Other Topics Concern  . Not on file   Social History Narrative  . No narrative on file    Family History  Problem Relation Age of Onset  . Kidney disease Father   . Bladder Cancer Father     ROS: no fevers or chills, productive cough, hemoptysis, dysphasia, odynophagia, melena, hematochezia, dysuria, hematuria, rash, seizure activity, orthopnea, PND, pedal edema, claudication. Remaining systems are  negative.  Physical Exam: Well-developed well-nourished in no acute distress.  Skin is warm and dry.  HEENT is normal.  Neck is supple.  Chest is clear to auscultation with normal expansion.  Cardiovascular exam is regular rate and rhythm.  Abdominal exam nontender or distended. No masses palpated. Extremities show no edema. neuro grossly intact  ECG

## 2016-07-07 ENCOUNTER — Encounter: Payer: Self-pay | Admitting: Cardiology

## 2016-07-07 ENCOUNTER — Ambulatory Visit (INDEPENDENT_AMBULATORY_CARE_PROVIDER_SITE_OTHER): Payer: Medicare Other | Admitting: Cardiology

## 2016-07-07 VITALS — BP 122/62 | HR 64 | Ht 68.0 in | Wt 161.0 lb

## 2016-07-07 DIAGNOSIS — I10 Essential (primary) hypertension: Secondary | ICD-10-CM

## 2016-07-07 DIAGNOSIS — E7800 Pure hypercholesterolemia, unspecified: Secondary | ICD-10-CM

## 2016-07-07 DIAGNOSIS — Z79899 Other long term (current) drug therapy: Secondary | ICD-10-CM

## 2016-07-07 DIAGNOSIS — I480 Paroxysmal atrial fibrillation: Secondary | ICD-10-CM | POA: Diagnosis not present

## 2016-07-07 NOTE — Progress Notes (Signed)
HPI: FU coronary artery disease, PAF. Patient had a left heart catheterization 15-20 years ago at [facility]. He presented 10/16 with atrial fibrillation and syncopal episode. The syncope was felt to be secondary to micturition. He converted to NSR spontaneously. EF was 60-65%.He was seen in f/u 04/29/15. He was back in AF with a rate of 100 and complaining of LE edema and DOE. His procardia was changed to Diltiazem and 3 x week HCTZ added. Monitor in October 2016 showed paroxysmal atrial fibrillation. Apixaban initiated at last ov. Since last seen patient has some dyspnea on exertion but no orthopnea, PND, pedal edema, chest pain or bleeding. He has had no recurrent syncope.  Current Outpatient Prescriptions  Medication Sig Dispense Refill  . apixaban (ELIQUIS) 5 MG TABS tablet Take 1 tablet (5 mg total) by mouth 2 (two) times daily. 180 tablet 3  . carvedilol (COREG) 6.25 MG tablet Take 1 tablet (6.25 mg total) by mouth 2 (two) times daily with a meal. 180 tablet 3  . diltiazem (CARDIZEM CD) 120 MG 24 hr capsule Take 1 capsule (120 mg total) by mouth daily. 90 capsule 3  . finasteride (PROSCAR) 5 MG tablet Take 5 mg by mouth daily.     . hydrochlorothiazide (MICROZIDE) 12.5 MG capsule Take 1 capsule (12.5 mg total) by mouth every Monday, Wednesday, and Friday. 108 capsule 3  . isosorbide mononitrate (IMDUR) 60 MG 24 hr tablet Take 1 tablet (60 mg total) by mouth daily. 90 tablet 3  . levothyroxine (SYNTHROID, LEVOTHROID) 150 MCG tablet Take 150 mcg by mouth daily before breakfast.     . lubiprostone (AMITIZA) 8 MCG capsule Take 8 mcg by mouth 2 (two) times daily with a meal.    . omeprazole (PRILOSEC) 20 MG capsule Take 20 mg by mouth daily.     . simvastatin (ZOCOR) 80 MG tablet Take 0.5 tablets (40 mg total) by mouth daily at 6 PM. 90 tablet 3  . tamsulosin (FLOMAX) 0.4 MG CAPS capsule Take 1 capsule (0.4 mg total) by mouth daily. 30 capsule 12   No current  facility-administered medications for this visit.      Past Medical History:  Diagnosis Date  . Arthritis   . Atrial fibrillation (HCC)   . BPH (benign prostatic hyperplasia)   . CAD (coronary artery disease)   . Dysuria   . HLD (hyperlipidemia)   . HTN (hypertension)   . Hypothyroid     Past Surgical History:  Procedure Laterality Date  . Collapsed Lung     x 2  . THYROID SURGERY    . TRANSURETHRAL RESECTION OF PROSTATE      Social History   Social History  . Marital status: Married    Spouse name: N/A  . Number of children: N/A  . Years of education: N/A   Occupational History  . Not on file.   Social History Main Topics  . Smoking status: Former Games developer  . Smokeless tobacco: Former Neurosurgeon    Types: Chew  . Alcohol use No  . Drug use: No  . Sexual activity: Not on file   Other Topics Concern  . Not on file   Social History Narrative  . No narrative on file    Family History  Problem Relation Age of Onset  . Kidney disease Father   . Bladder Cancer Father     ROS: no fevers or chills, productive cough, hemoptysis, dysphasia, odynophagia, melena, hematochezia, dysuria, hematuria, rash, seizure activity,  orthopnea, PND, pedal edema, claudication. Remaining systems are negative.  Physical Exam: Well-developed well-nourished in no acute distress.  Skin is warm and dry.  HEENT is normal.  Neck is supple.  Chest is clear to auscultation with normal expansion.  Cardiovascular exam is irrregular Abdominal exam nontender or distended. No masses palpated. Extremities show no edema. neuro grossly intact  ECG  A/P  1 syncope-patient with history of micturition syncope. No recent episodes.  2 Paroxysmal atrial fibrillation-patient remains in atrial fibrillation today on exam. Continue Coreg and Cardizem for rate control. Continue apixaban. No recent syncope. We again discussed the risk of falling on anticoagulation. I recommended that he stay seated when  urinating.  3 hypertension-blood pressure controlled. Continue present medications.  4 hyperlipidemia-continue statin.  5 coronary artery disease-continue statin. No aspirin given need for anticoagulation.  Olga MillersBrian Crenshaw, MD

## 2016-07-07 NOTE — Patient Instructions (Signed)
Medication Instructions:  Continue current medications  Labwork: CBC and BMP in 3 Months  Testing/Procedures: None Ordered  Follow-Up: Your physician wants you to follow-up in: 6 Months. You will receive a reminder letter in the mail two months in advance. If you don't receive a letter, please call our office to schedule the follow-up appointment.   Any Other Special Instructions Will Be Listed Below (If Applicable).   If you need a refill on your cardiac medications before your next appointment, please call your pharmacy.

## 2016-07-09 ENCOUNTER — Ambulatory Visit: Payer: Medicare Other | Admitting: Cardiology

## 2016-09-18 ENCOUNTER — Encounter: Payer: Self-pay | Admitting: *Deleted

## 2016-09-18 ENCOUNTER — Other Ambulatory Visit: Payer: Self-pay | Admitting: *Deleted

## 2016-09-18 DIAGNOSIS — I4891 Unspecified atrial fibrillation: Secondary | ICD-10-CM

## 2016-10-02 ENCOUNTER — Telehealth: Payer: Self-pay | Admitting: *Deleted

## 2016-10-02 LAB — BASIC METABOLIC PANEL
BUN/Creatinine Ratio: 14 (ref 10–24)
BUN: 23 mg/dL (ref 10–36)
CHLORIDE: 99 mmol/L (ref 96–106)
CO2: 24 mmol/L (ref 18–29)
CREATININE: 1.67 mg/dL — AB (ref 0.76–1.27)
Calcium: 9.3 mg/dL (ref 8.6–10.2)
GFR calc Af Amer: 40 mL/min/{1.73_m2} — ABNORMAL LOW (ref >59)
GFR, EST NON AFRICAN AMERICAN: 35 mL/min/{1.73_m2} — AB (ref >59)
GLUCOSE: 107 mg/dL — AB (ref 65–99)
Potassium: 4.2 mmol/L (ref 3.5–5.2)
Sodium: 139 mmol/L (ref 134–144)

## 2016-10-02 LAB — CBC
HEMATOCRIT: 41.6 % (ref 37.5–51.0)
Hemoglobin: 14 g/dL (ref 13.0–17.7)
MCH: 31.4 pg (ref 26.6–33.0)
MCHC: 33.7 g/dL (ref 31.5–35.7)
MCV: 93 fL (ref 79–97)
PLATELETS: 163 10*3/uL (ref 150–379)
RBC: 4.46 x10E6/uL (ref 4.14–5.80)
RDW: 13.8 % (ref 12.3–15.4)
WBC: 7.3 10*3/uL (ref 3.4–10.8)

## 2016-10-02 NOTE — Telephone Encounter (Signed)
Left message for becky to call

## 2016-10-02 NOTE — Telephone Encounter (Signed)
-----   Message from Lewayne Bunting, MD sent at 10/02/2016  7:14 AM EDT ----- Decrease apixaban to 2.5 mg BID Olga Millers

## 2016-10-05 MED ORDER — APIXABAN 5 MG PO TABS: 2.5000 mg | ORAL_TABLET | Freq: Two times a day (BID) | ORAL | 3 refills | Status: DC

## 2016-10-05 MED ORDER — APIXABAN 5 MG PO TABS: ORAL_TABLET | ORAL | 3 refills | Status: DC

## 2016-10-05 NOTE — Telephone Encounter (Signed)
Spoke with pt dtr, Aware of dr Ludwig Clarks recommendations. Patient daughter voiced understanding to cut the tablets they have in 1/2.

## 2016-10-05 NOTE — Telephone Encounter (Signed)
Follow up     Pt is returning your call from Friday    [phone] 10/06/16 if you cant reach her today

## 2016-10-14 ENCOUNTER — Telehealth: Payer: Self-pay | Admitting: Cardiology

## 2016-10-14 MED ORDER — APIXABAN 2.5 MG PO TABS: 2.5000 mg | ORAL_TABLET | Freq: Two times a day (BID) | ORAL | 3 refills | Status: DC

## 2016-10-14 NOTE — Telephone Encounter (Signed)
Pt's daughter need to speak to nurse concerning her dad's Eliquis prescription. Please call.

## 2016-10-14 NOTE — Telephone Encounter (Signed)
Spoke with pt dtr, refill for eliquis 2.5 mg sent to the pharmay electronically.

## 2016-10-14 NOTE — Telephone Encounter (Signed)
Left message for pt dtr to call  

## 2017-01-26 NOTE — Progress Notes (Signed)
HPI: FU coronary artery disease, PAF. Patient had a left heart catheterization 15-20 years ago at [facility]. He presented 10/16 with atrial fibrillation and syncopal episode. The syncope was felt to be secondary to micturition. He converted to NSR spontaneously. EF was 60-65%.He was seen in f/u 04/29/15. He was back in AF with a rate of 100 and complaining of LE edema and DOE. His procardia was changed to Diltiazem and 3 x week HCTZ added. Monitor in October 2016 showed paroxysmal atrial fibrillation. Apixaban initiated at last ov. Since last seen patient has mild dyspnea with more vigorous activities. No orthopnea, PND, pedal edema, palpitations, syncope or chest pain.  Current Outpatient Prescriptions  Medication Sig Dispense Refill  . apixaban (ELIQUIS) 2.5 MG TABS tablet Take 1 tablet (2.5 mg total) by mouth 2 (two) times daily. 180 tablet 3  . carvedilol (COREG) 6.25 MG tablet Take 1 tablet (6.25 mg total) by mouth 2 (two) times daily with a meal. 180 tablet 3  . diltiazem (CARDIZEM CD) 120 MG 24 hr capsule Take 1 capsule (120 mg total) by mouth daily. 90 capsule 3  . finasteride (PROSCAR) 5 MG tablet Take 5 mg by mouth daily.     . hydrochlorothiazide (MICROZIDE) 12.5 MG capsule Take 1 capsule (12.5 mg total) by mouth every Monday, Wednesday, and Friday. 108 capsule 3  . isosorbide mononitrate (IMDUR) 60 MG 24 hr tablet Take 1 tablet (60 mg total) by mouth daily. 90 tablet 3  . levothyroxine (SYNTHROID, LEVOTHROID) 150 MCG tablet Take 150 mcg by mouth daily before breakfast.     . lubiprostone (AMITIZA) 8 MCG capsule Take 8 mcg by mouth 2 (two) times daily with a meal.    . omeprazole (PRILOSEC) 20 MG capsule Take 20 mg by mouth daily.     . simvastatin (ZOCOR) 80 MG tablet Take 0.5 tablets (40 mg total) by mouth daily at 6 PM. 90 tablet 3  . tamsulosin (FLOMAX) 0.4 MG CAPS capsule Take 1 capsule (0.4 mg total) by mouth daily. 30 capsule 12   No current  facility-administered medications for this visit.      Past Medical History:  Diagnosis Date  . Arthritis   . Atrial fibrillation (HCC)   . BPH (benign prostatic hyperplasia)   . CAD (coronary artery disease)   . Dysuria   . HLD (hyperlipidemia)   . HTN (hypertension)   . Hypothyroid     Past Surgical History:  Procedure Laterality Date  . Collapsed Lung     x 2  . THYROID SURGERY    . TRANSURETHRAL RESECTION OF PROSTATE      Social History   Social History  . Marital status: Married    Spouse name: N/A  . Number of children: N/A  . Years of education: N/A   Occupational History  . Not on file.   Social History Main Topics  . Smoking status: Former Games developer  . Smokeless tobacco: Former Neurosurgeon    Types: Chew  . Alcohol use No  . Drug use: No  . Sexual activity: Not on file   Other Topics Concern  . Not on file   Social History Narrative  . No narrative on file    Family History  Problem Relation Age of Onset  . Kidney disease Father   . Bladder Cancer Father     ROS: no fevers or chills, productive cough, hemoptysis, dysphasia, odynophagia, melena, hematochezia, dysuria, hematuria, rash, seizure activity, orthopnea, PND, pedal edema,  claudication. Remaining systems are negative.  Physical Exam: Well-developed elderly in no acute distress.  Skin is warm and dry.  HEENT is normal.  Neck is supple.  Chest is clear to auscultation with normal expansion.  Cardiovascular exam is irregular Abdominal exam nontender or distended. No masses palpated. Extremities show no edema. neuro grossly intact  ECG- Atrial fibrillation at a rate of 59. Cannot rule out prior septal infarct. personally reviewed  A/P  1 paroxysmal atrial fibrillation-patient remains in atrial fibrillation today and this is likely permanent. He is not having any symptoms and we will plan rate control and anticoagulation. Continue apixaban. Check Hgb and renal function. His rate is borderline.  DC coreg and follow.  2 hypertension-blood pressure controlled. Continue present medications.  3 hyperlipidemia-continue statin. Check lipids and liver.  4 coronary artery disease-continue statin. No aspirin given need for anticoagulation.  5 history of micturition syncope-no recurrent episodes.  Olga MillersBrian Crenshaw, MD

## 2017-01-28 ENCOUNTER — Ambulatory Visit (INDEPENDENT_AMBULATORY_CARE_PROVIDER_SITE_OTHER): Payer: Medicare Other | Admitting: Cardiology

## 2017-01-28 ENCOUNTER — Encounter: Payer: Self-pay | Admitting: Cardiology

## 2017-01-28 VITALS — BP 138/76 | HR 59 | Ht 68.0 in | Wt 160.0 lb

## 2017-01-28 DIAGNOSIS — E7800 Pure hypercholesterolemia, unspecified: Secondary | ICD-10-CM

## 2017-01-28 DIAGNOSIS — I10 Essential (primary) hypertension: Secondary | ICD-10-CM | POA: Diagnosis not present

## 2017-01-28 DIAGNOSIS — I480 Paroxysmal atrial fibrillation: Secondary | ICD-10-CM

## 2017-01-28 DIAGNOSIS — I2510 Atherosclerotic heart disease of native coronary artery without angina pectoris: Secondary | ICD-10-CM

## 2017-01-28 NOTE — Patient Instructions (Signed)
Medication Instructions:   STOP CARVEDILOL  Labwork:  Your physician recommends that you HAVE LAB WORK TODAY  Follow-Up:  Your physician wants you to follow-up in: 6 MONTHS WITH DR Jens SomRENSHAW You will receive a reminder letter in the mail two months in advance. If you don't receive a letter, please call our office to schedule the follow-up appointment.   If you need a refill on your cardiac medications before your next appointment, please call your pharmacy.

## 2017-01-29 LAB — CBC
HEMOGLOBIN: 15.6 g/dL (ref 13.0–17.7)
Hematocrit: 45.2 % (ref 37.5–51.0)
MCH: 32.2 pg (ref 26.6–33.0)
MCHC: 34.5 g/dL (ref 31.5–35.7)
MCV: 93 fL (ref 79–97)
PLATELETS: 215 10*3/uL (ref 150–379)
RBC: 4.84 x10E6/uL (ref 4.14–5.80)
RDW: 14.6 % (ref 12.3–15.4)
WBC: 8.5 10*3/uL (ref 3.4–10.8)

## 2017-01-29 LAB — HEPATIC FUNCTION PANEL
ALT: 17 IU/L (ref 0–44)
AST: 22 IU/L (ref 0–40)
Albumin: 4.8 g/dL — ABNORMAL HIGH (ref 3.2–4.6)
Alkaline Phosphatase: 77 IU/L (ref 39–117)
BILIRUBIN TOTAL: 0.5 mg/dL (ref 0.0–1.2)
BILIRUBIN, DIRECT: 0.17 mg/dL (ref 0.00–0.40)
TOTAL PROTEIN: 7.4 g/dL (ref 6.0–8.5)

## 2017-01-29 LAB — BASIC METABOLIC PANEL
BUN/Creatinine Ratio: 14 (ref 10–24)
BUN: 22 mg/dL (ref 10–36)
CALCIUM: 9.7 mg/dL (ref 8.6–10.2)
CO2: 23 mmol/L (ref 20–29)
CREATININE: 1.59 mg/dL — AB (ref 0.76–1.27)
Chloride: 101 mmol/L (ref 96–106)
GFR calc Af Amer: 43 mL/min/{1.73_m2} — ABNORMAL LOW (ref >59)
GFR calc non Af Amer: 37 mL/min/{1.73_m2} — ABNORMAL LOW (ref >59)
GLUCOSE: 106 mg/dL — AB (ref 65–99)
Potassium: 5.2 mmol/L (ref 3.5–5.2)
SODIUM: 140 mmol/L (ref 134–144)

## 2017-01-29 LAB — LIPID PANEL
CHOL/HDL RATIO: 2.3 ratio (ref 0.0–5.0)
Cholesterol, Total: 129 mg/dL (ref 100–199)
HDL: 57 mg/dL (ref >39)
LDL CALC: 55 mg/dL (ref 0–99)
Triglycerides: 86 mg/dL (ref 0–149)
VLDL Cholesterol Cal: 17 mg/dL (ref 5–40)

## 2017-02-01 ENCOUNTER — Encounter: Payer: Self-pay | Admitting: *Deleted

## 2017-05-02 ENCOUNTER — Other Ambulatory Visit: Payer: Self-pay | Admitting: Cardiology

## 2017-05-08 ENCOUNTER — Other Ambulatory Visit: Payer: Self-pay | Admitting: Cardiology

## 2017-07-18 ENCOUNTER — Other Ambulatory Visit: Payer: Self-pay | Admitting: Cardiology

## 2017-07-19 NOTE — Telephone Encounter (Signed)
Rx request sent to pharmacy.  

## 2017-07-28 NOTE — Progress Notes (Signed)
HPI: FU coronary artery disease, PAF. Patient had a left heart catheterization 15-20 years ago at [facility]. He presented 10/16 with atrial fibrillation and syncopal episode. The syncope was felt to be secondary to micturition. He converted to NSR spontaneously. EF was 60-65%.He was seen in f/u 04/29/15. He was back in AF with a rate of 100 and complaining of LE edema and DOE. His procardia was changed to Diltiazem and 3 x week HCTZ added. Monitor in October 2016 showed paroxysmal atrial fibrillation. Apixaban initiated at last ov. Since last seen the patient denies any dyspnea on exertion, orthopnea, PND, pedal edema, palpitations, syncope or chest pain.   Current Outpatient Medications  Medication Sig Dispense Refill  . apixaban (ELIQUIS) 2.5 MG TABS tablet Take 1 tablet (2.5 mg total) by mouth 2 (two) times daily. 180 tablet 3  . diltiazem (CARDIZEM CD) 120 MG 24 hr capsule Take 1 capsule (120 mg total) by mouth daily. 90 capsule 3  . finasteride (PROSCAR) 5 MG tablet Take 5 mg by mouth daily.     . hydrochlorothiazide (MICROZIDE) 12.5 MG capsule TAKE ONE CAPSULE BY MOUTH MONDAY, WEDNESDAY, AND FRIDAY 36 capsule 11  . isosorbide mononitrate (IMDUR) 60 MG 24 hr tablet TAKE ONE TABLET BY MOUTH ONCE DAILY 90 tablet 3  . levothyroxine (SYNTHROID, LEVOTHROID) 150 MCG tablet Take 150 mcg by mouth daily before breakfast.     . lubiprostone (AMITIZA) 8 MCG capsule Take 8 mcg by mouth 2 (two) times daily with a meal.    . omeprazole (PRILOSEC) 20 MG capsule Take 20 mg by mouth daily.     . simvastatin (ZOCOR) 80 MG tablet TAKE ONE-HALF TABLET BY MOUTH ONCE DAILY AT 6 PM 45 tablet 7  . tamsulosin (FLOMAX) 0.4 MG CAPS capsule Take 1 capsule (0.4 mg total) by mouth daily. 30 capsule 12   No current facility-administered medications for this visit.      Past Medical History:  Diagnosis Date  . Arthritis   . Atrial fibrillation (HCC)   . BPH (benign prostatic hyperplasia)   . CAD  (coronary artery disease)   . Dysuria   . HLD (hyperlipidemia)   . HTN (hypertension)   . Hypothyroid     Past Surgical History:  Procedure Laterality Date  . Collapsed Lung     x 2  . THYROID SURGERY    . TRANSURETHRAL RESECTION OF PROSTATE      Social History   Socioeconomic History  . Marital status: Married    Spouse name: Not on file  . Number of children: Not on file  . Years of education: Not on file  . Highest education level: Not on file  Social Needs  . Financial resource strain: Not on file  . Food insecurity - worry: Not on file  . Food insecurity - inability: Not on file  . Transportation needs - medical: Not on file  . Transportation needs - non-medical: Not on file  Occupational History  . Not on file  Tobacco Use  . Smoking status: Former Games developer  . Smokeless tobacco: Former Neurosurgeon    Types: Chew  Substance and Sexual Activity  . Alcohol use: No    Alcohol/week: 0.0 oz  . Drug use: No  . Sexual activity: Not on file  Other Topics Concern  . Not on file  Social History Narrative  . Not on file    Family History  Problem Relation Age of Onset  . Kidney disease Father   .  Bladder Cancer Father     ROS: no fevers or chills, productive cough, hemoptysis, dysphasia, odynophagia, melena, hematochezia, dysuria, hematuria, rash, seizure activity, orthopnea, PND, pedal edema, claudication. Remaining systems are negative.  Physical Exam: Well-developed well-nourished in no acute distress.  Skin is warm and dry.  HEENT is normal.  Neck is supple.  Chest is clear to auscultation with normal expansion.  Cardiovascular exam is irregular Abdominal exam nontender or distended. No masses palpated. Extremities show no edema. neuro grossly intact   A/P  1 paroxysmal atrial fibrillation-remains in atrial fibrillation on exam; plan to continue apixaban at present dose.  Check hemoglobin and renal function.  He does not appear to be having symptoms.  We will  therefore plan rate control and anticoagulation.  2 hypertension-blood pressure is controlled.  Continue present medications. Check renal function.   3 hyperlipidemia-continue statin.  4 coronary artery disease-continue statin.  Patient is not on aspirin given need for anticoagulation.  5 history of micturition syncope-no recurrent episodes.  Olga MillersBrian Terreon Ekholm, MD

## 2017-08-10 ENCOUNTER — Encounter: Payer: Self-pay | Admitting: Cardiology

## 2017-08-10 ENCOUNTER — Ambulatory Visit (INDEPENDENT_AMBULATORY_CARE_PROVIDER_SITE_OTHER): Payer: Medicare Other | Admitting: Cardiology

## 2017-08-10 VITALS — BP 140/76 | HR 79 | Ht 68.0 in | Wt 185.0 lb

## 2017-08-10 DIAGNOSIS — I10 Essential (primary) hypertension: Secondary | ICD-10-CM

## 2017-08-10 DIAGNOSIS — I2510 Atherosclerotic heart disease of native coronary artery without angina pectoris: Secondary | ICD-10-CM | POA: Diagnosis not present

## 2017-08-10 DIAGNOSIS — I480 Paroxysmal atrial fibrillation: Secondary | ICD-10-CM

## 2017-08-10 DIAGNOSIS — E7800 Pure hypercholesterolemia, unspecified: Secondary | ICD-10-CM | POA: Diagnosis not present

## 2017-08-10 NOTE — Patient Instructions (Signed)
Medication Instructions:   NO CHANGE  Labwork:  Your physician recommends that you HAVE LAB WORK TODAY  Follow-Up:  Your physician wants you to follow-up in: ONE YEAR WITH DR CRENSHAW You will receive a reminder letter in the mail two months in advance. If you don't receive a letter, please call our office to schedule the follow-up appointment.   If you need a refill on your cardiac medications before your next appointment, please call your pharmacy.    

## 2017-08-11 ENCOUNTER — Encounter: Payer: Self-pay | Admitting: *Deleted

## 2017-08-11 LAB — BASIC METABOLIC PANEL
BUN / CREAT RATIO: 14 (ref 10–24)
BUN: 22 mg/dL (ref 10–36)
CALCIUM: 9.9 mg/dL (ref 8.6–10.2)
CHLORIDE: 99 mmol/L (ref 96–106)
CO2: 22 mmol/L (ref 20–29)
Creatinine, Ser: 1.55 mg/dL — ABNORMAL HIGH (ref 0.76–1.27)
GFR, EST AFRICAN AMERICAN: 44 mL/min/{1.73_m2} — AB (ref >59)
GFR, EST NON AFRICAN AMERICAN: 38 mL/min/{1.73_m2} — AB (ref >59)
Glucose: 83 mg/dL (ref 65–99)
POTASSIUM: 4.7 mmol/L (ref 3.5–5.2)
Sodium: 140 mmol/L (ref 134–144)

## 2017-08-11 LAB — CBC
HEMATOCRIT: 46.7 % (ref 37.5–51.0)
Hemoglobin: 16.2 g/dL (ref 13.0–17.7)
MCH: 32 pg (ref 26.6–33.0)
MCHC: 34.7 g/dL (ref 31.5–35.7)
MCV: 92 fL (ref 79–97)
PLATELETS: 194 10*3/uL (ref 150–379)
RBC: 5.06 x10E6/uL (ref 4.14–5.80)
RDW: 14.8 % (ref 12.3–15.4)
WBC: 9.9 10*3/uL (ref 3.4–10.8)

## 2017-10-26 ENCOUNTER — Other Ambulatory Visit: Payer: Self-pay | Admitting: Cardiology

## 2017-11-25 ENCOUNTER — Telehealth: Payer: Self-pay | Admitting: Podiatry

## 2017-12-21 ENCOUNTER — Ambulatory Visit (INDEPENDENT_AMBULATORY_CARE_PROVIDER_SITE_OTHER): Payer: Medicare Other | Admitting: Podiatry

## 2017-12-21 ENCOUNTER — Encounter: Payer: Self-pay | Admitting: Podiatry

## 2017-12-21 VITALS — BP 173/103 | HR 79

## 2017-12-21 DIAGNOSIS — M216X1 Other acquired deformities of right foot: Secondary | ICD-10-CM | POA: Diagnosis not present

## 2017-12-21 DIAGNOSIS — Q828 Other specified congenital malformations of skin: Secondary | ICD-10-CM | POA: Diagnosis not present

## 2017-12-21 NOTE — Progress Notes (Signed)
.   This patient presents the office with chief complaint of a painful skin lesion on the outside ball of his right foot.  He says this been present for years and is painful when he walks.  Vincent Mendez. He says he applies freezone  to this area and then removes the remaining callus himself.  . He says that the area has become extremely painful and the acid is not helping.  He was referred to this office by his medical doctor for an evaluation and treatment of this painful skin lesion.  General Appearance  Alert, conversant and in no acute stress.  Vascular  Dorsalis pedis and posterior tibial  pulses are palpable  bilaterally.  Capillary return is within normal limits  bilaterally. Temperature is within normal limits  bilaterally.  Neurologic  Senn-Weinstein monofilament wire test within normal limits  bilaterally. Muscle power within normal limits bilaterally.  Nails Normal nails noted with no evidence of bacterial and fungal infection.  Orthopedic  No limitations of motion of motion feet .  No crepitus or effusions noted.  Plantar flexed fifth metatarsal right foot.  Skin  normotropic skin with  noted bilaterally.  No signs of infections or ulcers noted.  Porokeratosis sub 5th metatarsal right foot.  . Porokeratosis, sub-fifth metatarsal right foot  . Plantarflexed fifth metatarsal right foot  IE  Debride callus.  Padding added to shoes.  RTC 3 months.   Helane GuntherGregory Cindia Hustead DPM

## 2017-12-24 ENCOUNTER — Emergency Department (HOSPITAL_BASED_OUTPATIENT_CLINIC_OR_DEPARTMENT_OTHER)
Admission: EM | Admit: 2017-12-24 | Discharge: 2017-12-24 | Disposition: A | Discharge status: Home / Self Care | Payer: Medicare Other | Attending: Emergency Medicine | Admitting: Emergency Medicine

## 2017-12-24 ENCOUNTER — Other Ambulatory Visit: Payer: Self-pay

## 2017-12-24 ENCOUNTER — Encounter (HOSPITAL_BASED_OUTPATIENT_CLINIC_OR_DEPARTMENT_OTHER): Payer: Self-pay | Admitting: Emergency Medicine

## 2017-12-24 DIAGNOSIS — R040 Epistaxis: Secondary | ICD-10-CM | POA: Diagnosis present

## 2017-12-24 DIAGNOSIS — I129 Hypertensive chronic kidney disease with stage 1 through stage 4 chronic kidney disease, or unspecified chronic kidney disease: Secondary | ICD-10-CM | POA: Diagnosis not present

## 2017-12-24 DIAGNOSIS — E039 Hypothyroidism, unspecified: Secondary | ICD-10-CM | POA: Diagnosis not present

## 2017-12-24 DIAGNOSIS — N183 Chronic kidney disease, stage 3 (moderate): Secondary | ICD-10-CM | POA: Diagnosis not present

## 2017-12-24 DIAGNOSIS — Z79899 Other long term (current) drug therapy: Secondary | ICD-10-CM | POA: Insufficient documentation

## 2017-12-24 DIAGNOSIS — Z7901 Long term (current) use of anticoagulants: Secondary | ICD-10-CM | POA: Insufficient documentation

## 2017-12-24 DIAGNOSIS — I2510 Atherosclerotic heart disease of native coronary artery without angina pectoris: Secondary | ICD-10-CM | POA: Insufficient documentation

## 2017-12-24 DIAGNOSIS — Z87891 Personal history of nicotine dependence: Secondary | ICD-10-CM | POA: Insufficient documentation

## 2017-12-24 DIAGNOSIS — J449 Chronic obstructive pulmonary disease, unspecified: Secondary | ICD-10-CM | POA: Diagnosis not present

## 2017-12-24 LAB — BASIC METABOLIC PANEL
ANION GAP: 10 (ref 5–15)
BUN: 31 mg/dL — ABNORMAL HIGH (ref 8–23)
CALCIUM: 8.7 mg/dL — AB (ref 8.9–10.3)
CHLORIDE: 106 mmol/L (ref 98–111)
CO2: 24 mmol/L (ref 22–32)
Creatinine, Ser: 1.65 mg/dL — ABNORMAL HIGH (ref 0.61–1.24)
GFR calc non Af Amer: 34 mL/min — ABNORMAL LOW (ref >60)
GFR, EST AFRICAN AMERICAN: 40 mL/min — AB (ref >60)
Glucose, Bld: 105 mg/dL — ABNORMAL HIGH (ref 70–99)
Potassium: 4.1 mmol/L (ref 3.5–5.1)
SODIUM: 140 mmol/L (ref 135–145)

## 2017-12-24 LAB — CBC
HEMATOCRIT: 42.6 % (ref 39.0–52.0)
HEMOGLOBIN: 14.5 g/dL (ref 13.0–17.0)
MCH: 32 pg (ref 26.0–34.0)
MCHC: 34 g/dL (ref 30.0–36.0)
MCV: 94 fL (ref 78.0–100.0)
Platelets: 195 10*3/uL (ref 150–400)
RBC: 4.53 MIL/uL (ref 4.22–5.81)
RDW: 14.2 % (ref 11.5–15.5)
WBC: 10.4 10*3/uL (ref 4.0–10.5)

## 2017-12-24 MED ORDER — OXYMETAZOLINE HCL 0.05 % NA SOLN: 2.0000 | NASAL | 0 refills | Status: DC | PRN

## 2017-12-24 MED FILL — 12 HOUR NASAL DECONGESTANT: 0.05 | 30 days supply | Qty: 30 | Fill #0

## 2017-12-24 NOTE — Discharge Instructions (Signed)
Hemoglobin and labs look good.  I was not able to see an obvious source of bleeding, but it seems to be stopped for now.  Please avoid nose blowing or touching or rubbing the nose as this can restart the bleeding.  If the nose does begin to bleed again please use 2 sprays of Afrin in each nostril and then hold pressure on the nose for 15 to 20 minutes constantly.  If you are still unable to control the bleeding place packing as you did today and return to the emergency department.  Otherwise please follow-up with ENT for further evaluation.

## 2017-12-24 NOTE — ED Provider Notes (Signed)
[facility] EMERGENCY DEPARTMENT Provider Note   CSN: [id number] Arrival date & time: 12/24/17  [id number]     History   Chief Complaint Chief Complaint  Patient presents with  . Epistaxis    HPI Vincent CashHassell L Brede Sr. is a 84 y.o. male.  Vincent CashHassell L Selvey Sr. is a 84 y.o. Male with history of hypertension, hyperlipidemia, CAD, A. fib currently anticoagulated on Eliquis, COPD and chronic constipation, who presents to the emergency department for evaluation of 3 days of intermittent nosebleeds.  Patient reports for the last 3 days he is woken up at about 4:00 in the morning to bleeding from his left nostril, he has been able to get the bleeding under control each day with the help of his son and daughter-in-law, was evaluated by his primary care doctor yesterday but did not see an active source of bleeding at that time.  Patient reports today bleeding was very heavy and it took them approximately an hour and a half to get it under control, bleeding was constant and he was passing several clots.  Family initially tried holding pressure but cannot get bleeding to stop, they used a rolled up paper towel and placed a plug back in the nose which seems to control the bleeding.  Family is concerned that he swallowed a lot of blood, denies nausea at this time.  He has been complaining of feeling weak and fatigued since this morning's episode.  He denies any chest pain or shortness of breath.  He reports a mild ache across his forehead this morning but has not had any severe headache associated with this bleed.  Each time it seems to bleed from the left nostril, not both sides.  Family reports he frequently has a small amount of nasal drainage and is always blowing his nose and blows it very hard.  No history of prior nosebleeds.     Past Medical History:  Diagnosis Date  . Arthritis   . Atrial fibrillation (HCC)   . BPH (benign prostatic hyperplasia)   . CAD (coronary artery disease)   .  Dysuria   . HLD (hyperlipidemia)   . HTN (hypertension)   . Hypothyroid     Patient Active Problem List   Diagnosis Date Noted  . COPD (chronic obstructive pulmonary disease) (HCC) 03/19/2015  . Syncope 03/18/2015  . Paroxysmal atrial fibrillation (HCC) 03/18/2015  . Coronary artery disease 03/18/2015  . Anemia 03/18/2015  . CKD (chronic kidney disease), stage III (HCC) 03/18/2015  . Hyperlipidemia 03/18/2015  . Hypertension 03/18/2015  . Hypothyroidism 03/18/2015  . Chronic constipation 03/18/2015  . B12 deficiency 03/18/2015  . Dysuria 01/13/2015  . BPH with obstruction/lower urinary tract symptoms 01/13/2015    Past Surgical History:  Procedure Laterality Date  . Collapsed Lung     x 2  . THYROID SURGERY    . TRANSURETHRAL RESECTION OF PROSTATE          Home Medications    Prior to Admission medications   Medication Sig Start Date End Date Taking? Authorizing Provider  carvedilol (COREG) 6.25 MG tablet Take by mouth.    [provider]  diltiazem (CARDIZEM CD) 120 MG 24 hr capsule Take 1 capsule (120 mg total) by mouth daily. 04/07/16   Lewayne Buntingrenshaw, Brian S, MD  ELIQUIS 2.5 MG TABS tablet TAKE 1 TABLET BY MOUTH TWICE DAILY 10/27/17   Lewayne Buntingrenshaw, Brian S, MD  finasteride (PROSCAR) 5 MG tablet Take 5 mg by mouth daily.  [provider]  hydrochlorothiazide (MICROZIDE) 12.5 MG capsule TAKE ONE CAPSULE BY MOUTH MONDAY, WEDNESDAY, AND FRIDAY 05/03/17   Lewayne Bunting, MD  isosorbide mononitrate (IMDUR) 60 MG 24 hr tablet TAKE ONE TABLET BY MOUTH ONCE DAILY 05/10/17   Lewayne Bunting, MD  levothyroxine (SYNTHROID, LEVOTHROID) 150 MCG tablet Take 150 mcg by mouth daily before breakfast.  01/07/15   [provider]  lubiprostone (AMITIZA) 8 MCG capsule Take 8 mcg by mouth 2 (two) times daily with a meal.    [provider]  omeprazole (PRILOSEC) 20 MG capsule Take 20 mg by mouth daily.  08/20/14   [provider]  oxymetazoline  (AFRIN) 0.05 % nasal spray Place 2 sprays into both nostrils as needed (for nosebleeds). If nose starts bleeding again placed 2 sprays in each nostril and then hold pressure for at least 15 minutes 12/24/17   Dartha Lodge, PA-C  simvastatin (ZOCOR) 80 MG tablet TAKE ONE-HALF TABLET BY MOUTH ONCE DAILY AT 6 PM 07/19/17   Lewayne Bunting, MD  tamsulosin (FLOMAX) 0.4 MG CAPS capsule Take 1 capsule (0.4 mg total) by mouth daily. 01/11/15   Harle Battiest, PA-C    Family History Family History  Problem Relation Age of Onset  . Kidney disease Father   . Bladder Cancer Father     Social History Social History   Tobacco Use  . Smoking status: Former Games developer  . Smokeless tobacco: Former Neurosurgeon    Types: Chew  Substance Use Topics  . Alcohol use: No    Alcohol/week: 0.0 oz  . Drug use: No     Allergies   Patient has no known allergies.   Review of Systems Review of Systems  Constitutional: Positive for fatigue. Negative for chills and fever.  HENT: Positive for nosebleeds and rhinorrhea. Negative for congestion, ear discharge, ear pain, sinus pressure and sinus pain.   Eyes: Negative for visual disturbance.  Respiratory: Negative for cough and shortness of breath.   Cardiovascular: Negative for chest pain.  Gastrointestinal: Negative for abdominal pain, nausea and vomiting.  Genitourinary: Negative for dysuria and frequency.  Musculoskeletal: Negative for arthralgias and myalgias.  Skin: Negative for color change and rash.  Neurological: Positive for weakness (Generalized) and light-headedness. Negative for dizziness, syncope and headaches.     Physical Exam Updated Vital Signs BP 99/71 (BP Location: Left Arm)   Pulse 95   Temp 97.9 F (36.6 C) (Oral)   Resp 18   Ht 5\' 6"  (1.676 m)   Wt 76.2 kg (168 lb)   SpO2 97%   BMI 27.12 kg/m   Physical Exam  Constitutional: He is oriented to person, place, and time. He appears well-developed and well-nourished. No distress.    HENT:  Head: Normocephalic and atraumatic.  Packing removed from left nostril with no active bleeding, unable to identify area of bleeding, likely more posterior Right nare clear with no evidence of bleeding Posterior oropharynx clear with no evidence of post-nasal bleeding  Eyes: Right eye exhibits no discharge. Left eye exhibits no discharge.  Neck: Normal range of motion. Neck supple.  Cardiovascular: Normal rate, regular rhythm, normal heart sounds and intact distal pulses.  Pulmonary/Chest: Effort normal and breath sounds normal. No respiratory distress.  Respirations equal and unlabored, patient able to speak in full sentences, lungs clear to auscultation bilaterally  Abdominal: Soft. Bowel sounds are normal. He exhibits no distension and no mass. There is no tenderness. There is no guarding.  Abdomen soft,  nondistended, nontender to palpation in all quadrants without guarding or peritoneal signs  Neurological: He is alert and oriented to person, place, and time. Coordination normal.  Speech is clear, able to follow commands CN III-XII intact Normal strength in upper and lower extremities bilaterally including dorsiflexion and plantar flexion, strong and equal grip strength Sensation normal to light and sharp touch Moves extremities without ataxia, coordination intact  Skin: Skin is warm and dry. He is not diaphoretic.  Psychiatric: He has a normal mood and affect. His behavior is normal.  Nursing note and vitals reviewed.    ED Treatments / Results  Labs (all labs ordered are listed, but only abnormal results are displayed) Labs Reviewed  BASIC METABOLIC PANEL - Abnormal; Notable for the following components:      Result Value   Glucose, Bld 105 (*)    BUN 31 (*)    Creatinine, Ser 1.65 (*)    Calcium 8.7 (*)    GFR calc non Af Amer 34 (*)    GFR calc Af Amer 40 (*)    All other components within normal limits  CBC    EKG None  Radiology No results  found.  Procedures Procedures (including critical care time)  Medications Ordered in ED Medications - No data to display   Initial Impression / Assessment and Plan / ED Course  I have reviewed the triage vital signs and the nursing notes.  Pertinent labs & imaging results that were available during my care of the patient were reviewed by me and considered in my medical decision making (see chart for details).  Patient presents for evaluation of nosebleeds.  Anticoagulated on Eliquis.  Bleeding is currently controlled, has had nose bleeds for the past 3 mornings, was seen by his PCP yesterday but he did not see an active source of bleeding.  Had large amount of bleeding today that took an hour and a half to get under control, patient currently has the left nostril packed with rolled up paper towel.  Will check basic labs.  On exam there is no bleeding from the right nostril, or any signs of prior bleeding, posterior oropharynx is clear there is no evidence of postnasal bleeding.  Packing removed from the left nostril and bleeding seems to be controlled, no obvious source of bleeding on exam.  Hemoglobin and electrolytes within normal limits, Cr at baseline.  Counseled patient to avoid touching or rubbing the nose or any nosebleeding as this is likely to restart the bleeding.  Counseled on strategies to stop bleeding if it occurs again.  Patient to follow-up with ENT.  Return precautions discussed.  Patient and family expressed understanding and are in agreement with plan.  Final Clinical Impressions(s) / ED Diagnoses   Final diagnoses:  Left-sided epistaxis  On continuous oral anticoagulation    ED Discharge Orders        Ordered    oxymetazoline (AFRIN) 0.05 % nasal spray  As needed     12/24/17 1050       Dartha Lodge, [place] 12/24/17 1052    Cathren Laine, MD 12/24/17 1245

## 2017-12-24 NOTE — ED Triage Notes (Signed)
Intermittent nosebleed x 3 days. Bleeding controlled at this time. Pt takes blood thinners.

## 2018-03-11 ENCOUNTER — Encounter: Payer: Self-pay | Admitting: Podiatry

## 2018-03-11 ENCOUNTER — Ambulatory Visit (INDEPENDENT_AMBULATORY_CARE_PROVIDER_SITE_OTHER): Payer: Medicare Other | Admitting: Podiatry

## 2018-03-11 DIAGNOSIS — M216X1 Other acquired deformities of right foot: Secondary | ICD-10-CM

## 2018-03-11 DIAGNOSIS — Q828 Other specified congenital malformations of skin: Secondary | ICD-10-CM

## 2018-03-11 NOTE — Progress Notes (Signed)
.   This patient presents the office with chief complaint of a painful skin lesion on the outside ball of his right foot.  He says that the callus has become very painful since his last visit in July and reiterates that it has been present for years.  He says it continues to be painful walking and wearing his shoes.  He presents the office today for continued evaluation and treatment of his painful callus right foot  General Appearance  Alert, conversant and in no acute stress.  Vascular  Dorsalis pedis and posterior tibial  pulses are palpable  bilaterally.  Capillary return is within normal limits  bilaterally. Temperature is within normal limits  bilaterally.  Neurologic  Senn-Weinstein monofilament wire test within normal limits  bilaterally. Muscle power within normal limits bilaterally.  Nails Normal nails noted with no evidence of bacterial and fungal infection.  Orthopedic  No limitations of motion of motion feet .  No crepitus or effusions noted.  Plantar flexed fifth metatarsal right foot.  Skin  normotropic skin with  noted bilaterally.  No signs of infections or ulcers noted.  Porokeratosis sub 5th metatarsal right foot.  . Porokeratosis, sub-fifth metatarsal right foot  . Plantarflexed fifth metatarsal right foot  ROV.  Debrided callus under the fifth metatarsal head right foot.  Discussed surgical correction with this patient.  Discussed the removal of the fifth metatarsal head and surgery.  This patient was referred to Dr. Charlsie Merles for surgical consultation.  Return to the clinic for preventative foot care services in the future as needed.   Helane Gunther DPM

## 2018-03-23 ENCOUNTER — Ambulatory Visit: Payer: Medicare Other | Admitting: Podiatry

## 2018-03-23 ENCOUNTER — Other Ambulatory Visit: Payer: Self-pay | Admitting: Cardiology

## 2018-03-30 ENCOUNTER — Other Ambulatory Visit: Payer: Self-pay | Admitting: Podiatry

## 2018-03-30 ENCOUNTER — Encounter: Payer: Self-pay | Admitting: Podiatry

## 2018-03-30 ENCOUNTER — Ambulatory Visit (INDEPENDENT_AMBULATORY_CARE_PROVIDER_SITE_OTHER): Payer: Medicare Other | Admitting: Podiatry

## 2018-03-30 ENCOUNTER — Ambulatory Visit (INDEPENDENT_AMBULATORY_CARE_PROVIDER_SITE_OTHER): Payer: Medicare Other

## 2018-03-30 DIAGNOSIS — M21621 Bunionette of right foot: Secondary | ICD-10-CM | POA: Diagnosis not present

## 2018-03-30 DIAGNOSIS — M79671 Pain in right foot: Secondary | ICD-10-CM

## 2018-03-30 DIAGNOSIS — Q828 Other specified congenital malformations of skin: Secondary | ICD-10-CM

## 2018-03-30 NOTE — Patient Instructions (Signed)
Pre-Operative Instructions  Congratulations, you have decided to take an important step towards improving your quality of life.  You can be assured that the doctors and staff at [facility] will be with you every step of the way.  Here are some important things you should know:  1. Plan to be at the surgery center/hospital at least 1 (one) hour prior to your scheduled time, unless otherwise directed by the surgical center/hospital staff.  You must have a responsible adult accompany you, remain during the surgery and drive you home.  Make sure you have directions to the surgical center/hospital to ensure you arrive on time. 2. If you are having surgery at [facility] or [facility] hospitals, you will need a copy of your medical history and physical form from your family physician within one month prior to the date of surgery. We will give you a form for your primary physician to complete.  3. We make every effort to accommodate the date you request for surgery.  However, there are times where surgery dates or times have to be moved.  We will contact you as soon as possible if a change in schedule is required.   4. No aspirin/ibuprofen for one week before surgery.  If you are on aspirin, any non-steroidal anti-inflammatory medications (Mobic, Aleve, Ibuprofen) should not be taken seven (7) days prior to your surgery.  You make take Tylenol for pain prior to surgery.  5. Medications - If you are taking daily heart and blood pressure medications, seizure, reflux, allergy, asthma, anxiety, pain or diabetes medications, make sure you notify the surgery center/hospital before the day of surgery so they can tell you which medications you should take or avoid the day of surgery. 6. No food or drink after midnight the night before surgery unless directed otherwise by surgical center/hospital staff. 7. No alcoholic beverages 24-hours prior to surgery.  No smoking 24-hours prior or 24-hours after  surgery. 8. Wear loose pants or shorts. They should be loose enough to fit over bandages, boots, and casts. 9. Don't wear slip-on shoes. Sneakers are preferred. 10. Bring your boot with you to the surgery center/hospital.  Also bring crutches or a walker if your physician has prescribed it for you.  If you do not have this equipment, it will be provided for you after surgery. 11. If you have not been contacted by the surgery center/hospital by the day before your surgery, call to confirm the date and time of your surgery. 12. Leave-time from work may vary depending on the type of surgery you have.  Appropriate arrangements should be made prior to surgery with your employer. 13. Prescriptions will be provided immediately following surgery by your doctor.  Fill these as soon as possible after surgery and take the medication as directed. Pain medications will not be refilled on weekends and must be approved by the doctor. 14. Remove nail polish on the operative foot and avoid getting pedicures prior to surgery. 15. Wash the night before surgery.  The night before surgery wash the foot and leg well with water and the antibacterial soap provided. Be sure to pay special attention to beneath the toenails and in between the toes.  Wash for at least three (3) minutes. Rinse thoroughly with water and dry well with a towel.  Perform this wash unless told not to do so by your physician.  Enclosed: 1 Ice pack (please put in freezer the night before surgery)   1 Hibiclens skin cleaner     Pre-op instructions  If you have any questions regarding the instructions, please do not hesitate to call our office.  [place]: [address], [place], [place] [address] -- [phone]  [place]: [address] [place], [place] [address] -- [phone]  [place]: [address]  [place], [place] [address] -- [phone]  [place]: [address], [address], [place], [place] [address] -- [phone]  Website:  [email or web address] 

## 2018-03-30 NOTE — Progress Notes (Signed)
Subjective:   Patient ID: Vincent Cash Sr., male   DOB: 84 y.o.   MRN: [id number]   HPI Patient presents with his father stating he is here for consult to get this fixed as it is been killing him and only gets better for 2 weeks with trimming.  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  They are willing to accept risk and wants surgery and signed consent form at the current time and also we discussed the fact he is on blood thinner and I like him to stop it 5 days prior and he is come to talk to his A. fib clinic and if there is any issues they will contact me but he has done it before so there should not be any issues with him stopping the medication.  Patient is encouraged to call with any questions prior to procedure which is scheduled for about 4 weeks from now  X-ray indicated the area is near the head of the fifth metatarsal base the proximal phalanx right foot

## 2018-04-06 ENCOUNTER — Telehealth: Payer: Self-pay | Admitting: *Deleted

## 2018-04-06 NOTE — Telephone Encounter (Addendum)
"  I was told to ask my dad's cardiologist how many days before my dad's surgery on November 19 could he be off his coumadin.  I called them and they said that you all needed to contact them with the request.  They would respond to you and then you would tell us.  I'm trying to confirm the process of getting all this taken care of before his surgery on November 19.  He sees Dr. Jens Som.  The phone number is [phone].  I informed him that I will send a letter to Dr. Jens Som requesting the medical clearance.

## 2018-04-12 ENCOUNTER — Encounter: Payer: Self-pay | Admitting: *Deleted

## 2018-04-12 NOTE — Telephone Encounter (Signed)
I sent a medical clearance request to Dr. Olga Millers.

## 2018-04-12 NOTE — Progress Notes (Signed)
I sent a medical clearance request to Dr. Brian Crenshaw. 

## 2018-04-19 ENCOUNTER — Telehealth: Payer: Self-pay | Admitting: *Deleted

## 2018-04-19 NOTE — Telephone Encounter (Signed)
"  I am the daughter-in-law of Vincent Mendez.  He is scheduled to have a corn/bone removal on the nineteenth.  I just want to make sure everything is in order.  My husband is out of town right now and could not call.  So, I'm trying to follow-up because my father-in-law is on Eliquis and some other medications.  We want to make sure you checked with the heart doctor and that they are okay with the surgery.  If you could, give me a call back at my work number or on my cell.  Thank you very much."

## 2018-04-20 NOTE — Telephone Encounter (Signed)
I am returning your call.  Dr. Charlsie Merles said of course there is a risk due to his age.  He said Mr. Memmott will not receive general anesthesia.  He said he will recommend a light sedation to the anesthesiologist.  He said the anesthesiologist will determine if they want to give him sedation.  Dr. Charlsie Merles said if they don't want to give him the light sedation, he will just give Mr. Purdy a local injection around the problem area.  "Okay, sounds good."  Dr. Jens Som said that Mr. Wandel can stop taking his Apixaban, two days prior to his surgery date.  "Okay great, I will let my husband know."

## 2018-04-29 ENCOUNTER — Telehealth: Payer: Self-pay | Admitting: *Deleted

## 2018-04-29 NOTE — Telephone Encounter (Signed)
"  I'm calling about my dad.  He's scheduled for surgery on Tuesday.  I haven't heard anything about a time, where we need to go, or anything."  We gave him a brochure in his bag.  The brochure has the address on the back of it.  Someone from the surgical center will call him either today or on Monday and give him his arrival time.  You can call them if you like.  "What's their phone number?"  It is [phone].  It is also on the brochure that we gave him.  He cannot eat or drink anything after midnight.  He has a scrub brush in his bag.  He needs to use it the night before his surgery date to clean his foot.  He also has a gel pack in his bag.  Go ahead and put that in the freezer, he's to use it once he gets home from surgery.  "Okay, I'll call the surgery center."

## 2018-05-01 ENCOUNTER — Other Ambulatory Visit: Payer: Self-pay | Admitting: Cardiology

## 2018-05-03 ENCOUNTER — Telehealth: Payer: Self-pay | Admitting: *Deleted

## 2018-05-03 ENCOUNTER — Encounter: Payer: Self-pay | Admitting: Podiatry

## 2018-05-03 DIAGNOSIS — M21541 Acquired clubfoot, right foot: Secondary | ICD-10-CM | POA: Diagnosis not present

## 2018-05-03 NOTE — Telephone Encounter (Signed)
I informed Vincent Mendez - [facility] pt had surgery today and should be dated for today.

## 2018-05-03 NOTE — Telephone Encounter (Signed)
[id number] has prescription from Dr. Charlsie Merlesegal without a date, pt had surgery today.

## 2018-05-04 ENCOUNTER — Telehealth: Payer: Self-pay | Admitting: *Deleted

## 2018-05-04 ENCOUNTER — Telehealth: Payer: Self-pay | Admitting: Podiatry

## 2018-05-04 NOTE — Telephone Encounter (Signed)
Called and left a message for the patient to call the office [phone] concerning the surgery with Dr Charlsie Merlesegal on Tuesday. Misty StanleyLisa

## 2018-05-04 NOTE — Telephone Encounter (Signed)
Please call Becky back, she was returning your phone call. After 3:30pm please try her cell phone

## 2018-05-11 ENCOUNTER — Ambulatory Visit (INDEPENDENT_AMBULATORY_CARE_PROVIDER_SITE_OTHER): Payer: Medicare Other | Admitting: Podiatry

## 2018-05-11 ENCOUNTER — Ambulatory Visit (INDEPENDENT_AMBULATORY_CARE_PROVIDER_SITE_OTHER): Payer: Medicare Other

## 2018-05-11 DIAGNOSIS — M21621 Bunionette of right foot: Secondary | ICD-10-CM | POA: Diagnosis not present

## 2018-05-11 DIAGNOSIS — M216X1 Other acquired deformities of right foot: Secondary | ICD-10-CM

## 2018-05-11 NOTE — Progress Notes (Signed)
Subjective:   Patient ID: Vincent Mendez L Cavagnaro Sr., male   DOB: 84 y.o.   MRN: [id number]   HPI Patient states doing very well with surgery with minimal discomfort   ROS      Objective:  Physical Exam  Neurovascular status is intact negative Homans sign wound edges well coapted fifth metatarsal right     Assessment:  Doing well fifth metatarsal head resection right     Plan:  Reviewed x-ray sterile dressing reapplied continue surgical shoe and reappoint to recheck 2 weeks for suture removal or earlier if needed  X-ray indicated satisfactory resection of the fifth metatarsal head right

## 2018-05-25 ENCOUNTER — Encounter: Payer: Self-pay | Admitting: Podiatry

## 2018-05-25 ENCOUNTER — Ambulatory Visit (INDEPENDENT_AMBULATORY_CARE_PROVIDER_SITE_OTHER): Payer: Medicare Other

## 2018-05-25 ENCOUNTER — Ambulatory Visit (INDEPENDENT_AMBULATORY_CARE_PROVIDER_SITE_OTHER): Payer: Self-pay | Admitting: Podiatry

## 2018-05-25 DIAGNOSIS — M216X1 Other acquired deformities of right foot: Secondary | ICD-10-CM

## 2018-05-25 DIAGNOSIS — M21621 Bunionette of right foot: Secondary | ICD-10-CM | POA: Diagnosis not present

## 2018-05-25 NOTE — Progress Notes (Signed)
Subjective:   Patient ID: Vincent CashHassell L Gurry Sr., Vincent Mendez   DOB: 84 y.o.   MRN: [id number]   HPI Patient presents stating he is doing well and is having minimal discomfort and is very pleased with his surgery   ROS      Objective:  Physical Exam  Neurovascular status intact with patient's lateral side right foot healing very well with minimal edema and wound edges well coapted     Assessment:  Doing well post fifth metatarsal head resection right     Plan:  Stitches removed wound edges coapted well begin gradual soft to usage and reappoint as needed  X-rays indicate that the head of the fifth metatarsal has been appropriately removed and is in good position with no indications of pathology

## 2018-06-18 ENCOUNTER — Other Ambulatory Visit: Payer: Self-pay | Admitting: Cardiology

## 2018-07-27 ENCOUNTER — Encounter: Payer: Self-pay | Admitting: Cardiology

## 2018-07-28 NOTE — Progress Notes (Signed)
HPI: FU coronary artery disease, PAF. Patient had a left heart catheterization 15-20 years ago at [facility]. He presented 10/16 with atrial fibrillation and syncopal episode. The syncope was felt to be secondary to micturition. He converted to NSR spontaneously. EF was 60-65%.He was seen in f/u 04/29/15. He was back in AF with a rate of 100 and complaining of LE edema and DOE. His procardia was changed to Diltiazem and 3 x week HCTZ added. Monitor in October 2016 showed paroxysmal atrial fibrillation. Apixaban initiated at last ov. Since last seen patient denies dyspnea, chest pain, palpitations or syncope.  He had a nosebleed approximately 6 months ago but none since.  Current Outpatient Medications  Medication Sig Dispense Refill  . carvedilol (COREG) 6.25 MG tablet Take by mouth.    . diltiazem (CARDIZEM CD) 120 MG 24 hr capsule Take 1 capsule (120 mg total) by mouth daily. 90 capsule 3  . ELIQUIS 2.5 MG TABS tablet TAKE 1 TABLET BY MOUTH TWICE DAILY 180 tablet 1  . finasteride (PROSCAR) 5 MG tablet Take 5 mg by mouth daily.     . hydrochlorothiazide (MICROZIDE) 12.5 MG capsule TAKE 1 CAPSULE BY MOUTH MONDAY,WEDNESDAY,AND FRIDAY 36 capsule 2  . isosorbide mononitrate (IMDUR) 60 MG 24 hr tablet TAKE 1 TABLET BY MOUTH ONCE DAILY 90 tablet 1  . levothyroxine (SYNTHROID, LEVOTHROID) 150 MCG tablet Take 150 mcg by mouth daily before breakfast.     . lubiprostone (AMITIZA) 8 MCG capsule Take 8 mcg by mouth 2 (two) times daily with a meal.    . omeprazole (PRILOSEC) 20 MG capsule Take 20 mg by mouth daily.     Marland Kitchen oxymetazoline (AFRIN) 0.05 % nasal spray Place 2 sprays into both nostrils as needed (for nosebleeds). If nose starts bleeding again placed 2 sprays in each nostril and then hold pressure for at least 15 minutes 30 mL 0  . simvastatin (ZOCOR) 80 MG tablet TAKE ONE-HALF TABLET BY MOUTH ONCE DAILY AT 6 PM 45 tablet 7  . tamsulosin (FLOMAX) 0.4 MG CAPS capsule Take 1 capsule (0.4  mg total) by mouth daily. 30 capsule 12   No current facility-administered medications for this visit.      Past Medical History:  Diagnosis Date  . Arthritis   . Atrial fibrillation (HCC)   . BPH (benign prostatic hyperplasia)   . CAD (coronary artery disease)   . Dysuria   . HLD (hyperlipidemia)   . HTN (hypertension)   . Hypothyroid     Past Surgical History:  Procedure Laterality Date  . Collapsed Lung     x 2  . THYROID SURGERY    . TRANSURETHRAL RESECTION OF PROSTATE      Social History   Socioeconomic History  . Marital status: Married    Spouse name: Not on file  . Number of children: Not on file  . Years of education: Not on file  . Highest education level: Not on file  Occupational History  . Not on file  Social Needs  . Financial resource strain: Not on file  . Food insecurity:    Worry: Not on file    Inability: Not on file  . Transportation needs:    Medical: Not on file    Non-medical: Not on file  Tobacco Use  . Smoking status: Former Games developer  . Smokeless tobacco: Former Neurosurgeon    Types: Chew  Substance and Sexual Activity  . Alcohol use: No    Alcohol/week:  0.0 standard drinks  . Drug use: No  . Sexual activity: Not on file  Lifestyle  . Physical activity:    Days per week: Not on file    Minutes per session: Not on file  . Stress: Not on file  Relationships  . Social connections:    Talks on phone: Not on file    Gets together: Not on file    Attends religious service: Not on file    Active member of club or organization: Not on file    Attends meetings of clubs or organizations: Not on file    Relationship status: Not on file  . Intimate partner violence:    Fear of current or ex partner: Not on file    Emotionally abused: Not on file    Physically abused: Not on file    Forced sexual activity: Not on file  Other Topics Concern  . Not on file  Social History Narrative  . Not on file    Family History  Problem Relation Age of  Onset  . Kidney disease Father   . Bladder Cancer Father     ROS: no fevers or chills, productive cough, hemoptysis, dysphasia, odynophagia, melena, hematochezia, dysuria, hematuria, rash, seizure activity, orthopnea, PND, pedal edema, claudication. Remaining systems are negative.  Physical Exam: Well-developed well-nourished in no acute distress.  Skin is warm and dry.  HEENT is normal.  Neck is supple.  Chest is clear to auscultation with normal expansion.  Cardiovascular exam is irregular Abdominal exam nontender or distended. No masses palpated. Extremities show no edema. neuro grossly intact  ECG-atrial fibrillation at a rate of 77, normal axis, no ST changes.  Personally reviewed  A/P  1 paroxysmal atrial fibrillation-patient remains in atrial fibrillation today.  He is asymptomatic.  Our plan is rate control and anticoagulation.  Continue Cardizem, coreg and apixaban.  Check hemoglobin and renal function.  2 hypertension-patient's blood pressure is controlled.  Continue present medications and follow.  3 hyperlipidemia-continue statin.  4 coronary artery disease-plan to continue medical therapy as he is not having chest pain.  Continue statin.  No aspirin given need for apixaban.  5 history of micturition syncope-no recurrent episodes.  Olga MillersBrian Wrenly Lauritsen, MD

## 2018-08-11 ENCOUNTER — Encounter (INDEPENDENT_AMBULATORY_CARE_PROVIDER_SITE_OTHER): Payer: Self-pay

## 2018-08-11 ENCOUNTER — Ambulatory Visit (INDEPENDENT_AMBULATORY_CARE_PROVIDER_SITE_OTHER): Payer: Medicare Other | Admitting: Cardiology

## 2018-08-11 ENCOUNTER — Encounter: Payer: Self-pay | Admitting: Cardiology

## 2018-08-11 VITALS — BP 132/70 | HR 77 | Ht 66.0 in | Wt 159.0 lb

## 2018-08-11 DIAGNOSIS — I10 Essential (primary) hypertension: Secondary | ICD-10-CM | POA: Diagnosis not present

## 2018-08-11 DIAGNOSIS — I480 Paroxysmal atrial fibrillation: Secondary | ICD-10-CM

## 2018-08-11 DIAGNOSIS — E7800 Pure hypercholesterolemia, unspecified: Secondary | ICD-10-CM | POA: Diagnosis not present

## 2018-08-11 DIAGNOSIS — I2510 Atherosclerotic heart disease of native coronary artery without angina pectoris: Secondary | ICD-10-CM | POA: Diagnosis not present

## 2018-08-11 LAB — COMPREHENSIVE METABOLIC PANEL
ALK PHOS: 77 IU/L (ref 39–117)
ALT: 15 IU/L (ref 0–44)
AST: 17 IU/L (ref 0–40)
Albumin/Globulin Ratio: 1.8 (ref 1.2–2.2)
Albumin: 4.6 g/dL (ref 3.5–4.6)
BUN/Creatinine Ratio: 16 (ref 10–24)
BUN: 27 mg/dL (ref 10–36)
Bilirubin Total: 0.6 mg/dL (ref 0.0–1.2)
CHLORIDE: 98 mmol/L (ref 96–106)
CO2: 23 mmol/L (ref 20–29)
CREATININE: 1.73 mg/dL — AB (ref 0.76–1.27)
Calcium: 9.6 mg/dL (ref 8.6–10.2)
GFR calc Af Amer: 38 mL/min/{1.73_m2} — ABNORMAL LOW (ref >59)
GFR calc non Af Amer: 33 mL/min/{1.73_m2} — ABNORMAL LOW (ref >59)
GLOBULIN, TOTAL: 2.6 g/dL (ref 1.5–4.5)
GLUCOSE: 108 mg/dL — AB (ref 65–99)
POTASSIUM: 4.8 mmol/L (ref 3.5–5.2)
SODIUM: 139 mmol/L (ref 134–144)
Total Protein: 7.2 g/dL (ref 6.0–8.5)

## 2018-08-11 LAB — LIPID PANEL
CHOLESTEROL TOTAL: 134 mg/dL (ref 100–199)
Chol/HDL Ratio: 2.5 ratio (ref 0.0–5.0)
HDL: 54 mg/dL (ref >39)
LDL CALC: 60 mg/dL (ref 0–99)
TRIGLYCERIDES: 102 mg/dL (ref 0–149)
VLDL Cholesterol Cal: 20 mg/dL (ref 5–40)

## 2018-08-11 LAB — CBC
HEMATOCRIT: 46.7 % (ref 37.5–51.0)
Hemoglobin: 15.8 g/dL (ref 13.0–17.7)
MCH: 31.7 pg (ref 26.6–33.0)
MCHC: 33.8 g/dL (ref 31.5–35.7)
MCV: 94 fL (ref 79–97)
Platelets: 184 10*3/uL (ref 150–450)
RBC: 4.99 x10E6/uL (ref 4.14–5.80)
RDW: 13.6 % (ref 11.6–15.4)
WBC: 8.7 10*3/uL (ref 3.4–10.8)

## 2018-08-11 NOTE — Patient Instructions (Signed)
Medication Instructions:  NO CHANGE If you need a refill on your cardiac medications before your next appointment, please call your pharmacy.   Lab work: Your physician recommends that you HAVE LAB WORK TODAY If you have labs (blood work) drawn today and your tests are completely normal, you will receive your results only by: . MyChart Message (if you have MyChart) OR . A paper copy in the mail If you have any lab test that is abnormal or we need to change your treatment, we will call you to review the results.  Follow-Up: At [facility], you and your health needs are our priority.  As part of our continuing mission to provide you with exceptional heart care, we have created designated Provider Care Teams.  These Care Teams include your primary Cardiologist (physician) and Advanced Practice Providers (APPs -  Physician Assistants and Nurse Practitioners) who all work together to provide you with the care you need, when you need it. You will need a follow up appointment in 12 months.  Please call our office 2 months in advance to schedule this appointment.  You may see BRIAN CRENSHAW MD or one of the following Advanced Practice Providers on your designated Care Team:   Luke Kilroy, PA-C Krista Kroeger, PA-C . Callie Goodrich, PA-C     

## 2018-08-12 ENCOUNTER — Encounter: Payer: Self-pay | Admitting: *Deleted

## 2018-10-12 ENCOUNTER — Other Ambulatory Visit: Payer: Self-pay | Admitting: Cardiology

## 2018-10-13 NOTE — Telephone Encounter (Signed)
Simvastatin 80 mg refilled.

## 2018-11-01 ENCOUNTER — Other Ambulatory Visit: Payer: Self-pay | Admitting: Cardiology

## 2018-11-01 NOTE — Telephone Encounter (Signed)
85yo Scr = 1.73 on 08/11/2018 Wt 72kg OV 08/11/2018 with DR Jens Som

## 2018-12-14 ENCOUNTER — Encounter (HOSPITAL_COMMUNITY): Payer: Self-pay | Admitting: *Deleted

## 2018-12-14 ENCOUNTER — Other Ambulatory Visit: Payer: Self-pay

## 2018-12-14 ENCOUNTER — Observation Stay (HOSPITAL_COMMUNITY)
Admission: EM | Admit: 2018-12-14 | Discharge: 2018-12-15 | Disposition: A | Discharge status: Home / Self Care | Payer: Medicare Other | Attending: Internal Medicine | Admitting: Internal Medicine

## 2018-12-14 ENCOUNTER — Emergency Department (HOSPITAL_COMMUNITY): Payer: Medicare Other

## 2018-12-14 DIAGNOSIS — I480 Paroxysmal atrial fibrillation: Secondary | ICD-10-CM | POA: Diagnosis not present

## 2018-12-14 DIAGNOSIS — Z87891 Personal history of nicotine dependence: Secondary | ICD-10-CM | POA: Insufficient documentation

## 2018-12-14 DIAGNOSIS — I129 Hypertensive chronic kidney disease with stage 1 through stage 4 chronic kidney disease, or unspecified chronic kidney disease: Secondary | ICD-10-CM | POA: Insufficient documentation

## 2018-12-14 DIAGNOSIS — R55 Syncope and collapse: Secondary | ICD-10-CM

## 2018-12-14 DIAGNOSIS — I10 Essential (primary) hypertension: Secondary | ICD-10-CM | POA: Diagnosis present

## 2018-12-14 DIAGNOSIS — Z79890 Hormone replacement therapy (postmenopausal): Secondary | ICD-10-CM | POA: Insufficient documentation

## 2018-12-14 DIAGNOSIS — Z79899 Other long term (current) drug therapy: Secondary | ICD-10-CM | POA: Diagnosis not present

## 2018-12-14 DIAGNOSIS — E785 Hyperlipidemia, unspecified: Secondary | ICD-10-CM | POA: Diagnosis present

## 2018-12-14 DIAGNOSIS — R9431 Abnormal electrocardiogram [ECG] [EKG]: Secondary | ICD-10-CM

## 2018-12-14 DIAGNOSIS — I951 Orthostatic hypotension: Secondary | ICD-10-CM | POA: Diagnosis not present

## 2018-12-14 DIAGNOSIS — R630 Anorexia: Secondary | ICD-10-CM | POA: Diagnosis not present

## 2018-12-14 DIAGNOSIS — E039 Hypothyroidism, unspecified: Secondary | ICD-10-CM | POA: Diagnosis not present

## 2018-12-14 DIAGNOSIS — M6281 Muscle weakness (generalized): Secondary | ICD-10-CM | POA: Diagnosis not present

## 2018-12-14 DIAGNOSIS — Z841 Family history of disorders of kidney and ureter: Secondary | ICD-10-CM | POA: Insufficient documentation

## 2018-12-14 DIAGNOSIS — Z66 Do not resuscitate: Secondary | ICD-10-CM | POA: Diagnosis not present

## 2018-12-14 DIAGNOSIS — N183 Chronic kidney disease, stage 3 unspecified: Secondary | ICD-10-CM | POA: Diagnosis present

## 2018-12-14 DIAGNOSIS — K219 Gastro-esophageal reflux disease without esophagitis: Secondary | ICD-10-CM | POA: Insufficient documentation

## 2018-12-14 DIAGNOSIS — N138 Other obstructive and reflux uropathy: Secondary | ICD-10-CM | POA: Diagnosis present

## 2018-12-14 DIAGNOSIS — J449 Chronic obstructive pulmonary disease, unspecified: Secondary | ICD-10-CM | POA: Insufficient documentation

## 2018-12-14 DIAGNOSIS — R351 Nocturia: Secondary | ICD-10-CM | POA: Diagnosis not present

## 2018-12-14 DIAGNOSIS — R2681 Unsteadiness on feet: Secondary | ICD-10-CM | POA: Insufficient documentation

## 2018-12-14 DIAGNOSIS — Z7901 Long term (current) use of anticoagulants: Secondary | ICD-10-CM | POA: Insufficient documentation

## 2018-12-14 DIAGNOSIS — I482 Chronic atrial fibrillation, unspecified: Secondary | ICD-10-CM | POA: Diagnosis present

## 2018-12-14 DIAGNOSIS — Z1159 Encounter for screening for other viral diseases: Secondary | ICD-10-CM | POA: Insufficient documentation

## 2018-12-14 DIAGNOSIS — R300 Dysuria: Secondary | ICD-10-CM | POA: Diagnosis not present

## 2018-12-14 DIAGNOSIS — N401 Enlarged prostate with lower urinary tract symptoms: Secondary | ICD-10-CM | POA: Diagnosis not present

## 2018-12-14 DIAGNOSIS — M1990 Unspecified osteoarthritis, unspecified site: Secondary | ICD-10-CM | POA: Diagnosis not present

## 2018-12-14 DIAGNOSIS — I2510 Atherosclerotic heart disease of native coronary artery without angina pectoris: Secondary | ICD-10-CM | POA: Diagnosis present

## 2018-12-14 DIAGNOSIS — Z87898 Personal history of other specified conditions: Secondary | ICD-10-CM | POA: Diagnosis present

## 2018-12-14 HISTORY — DX: Syncope and collapse: R55

## 2018-12-14 HISTORY — DX: Gastro-esophageal reflux disease without esophagitis: K21.9

## 2018-12-14 LAB — URINALYSIS, ROUTINE W REFLEX MICROSCOPIC
Bilirubin Urine: NEGATIVE
Glucose, UA: NEGATIVE mg/dL
Hgb urine dipstick: NEGATIVE
Ketones, ur: NEGATIVE mg/dL
Leukocytes,Ua: NEGATIVE
Nitrite: NEGATIVE
Protein, ur: NEGATIVE mg/dL
Specific Gravity, Urine: 1.008 (ref 1.005–1.030)
pH: 5 (ref 5.0–8.0)

## 2018-12-14 LAB — COMPREHENSIVE METABOLIC PANEL
ALT: 20 U/L (ref 0–44)
AST: 27 U/L (ref 15–41)
Albumin: 4 g/dL (ref 3.5–5.0)
Alkaline Phosphatase: 60 U/L (ref 38–126)
Anion gap: 11 (ref 5–15)
BUN: 19 mg/dL (ref 8–23)
CO2: 23 mmol/L (ref 22–32)
Calcium: 9.4 mg/dL (ref 8.9–10.3)
Chloride: 103 mmol/L (ref 98–111)
Creatinine, Ser: 1.67 mg/dL — ABNORMAL HIGH (ref 0.61–1.24)
GFR calc Af Amer: 40 mL/min — ABNORMAL LOW (ref >60)
GFR calc non Af Amer: 35 mL/min — ABNORMAL LOW (ref >60)
Glucose, Bld: 124 mg/dL — ABNORMAL HIGH (ref 70–99)
Potassium: 3.9 mmol/L (ref 3.5–5.1)
Sodium: 137 mmol/L (ref 135–145)
Total Bilirubin: 1.2 mg/dL (ref 0.3–1.2)
Total Protein: 7.2 g/dL (ref 6.5–8.1)

## 2018-12-14 LAB — CBC WITH DIFFERENTIAL/PLATELET
Abs Immature Granulocytes: 0.02 10*3/uL (ref 0.00–0.07)
Basophils Absolute: 0 10*3/uL (ref 0.0–0.1)
Basophils Relative: 0 %
Eosinophils Absolute: 0.1 10*3/uL (ref 0.0–0.5)
Eosinophils Relative: 1 %
HCT: 46.2 % (ref 39.0–52.0)
Hemoglobin: 15.2 g/dL (ref 13.0–17.0)
Immature Granulocytes: 0 %
Lymphocytes Relative: 20 %
Lymphs Abs: 1.9 10*3/uL (ref 0.7–4.0)
MCH: 31.1 pg (ref 26.0–34.0)
MCHC: 32.9 g/dL (ref 30.0–36.0)
MCV: 94.7 fL (ref 80.0–100.0)
Monocytes Absolute: 0.6 10*3/uL (ref 0.1–1.0)
Monocytes Relative: 7 %
Neutro Abs: 6.8 10*3/uL (ref 1.7–7.7)
Neutrophils Relative %: 72 %
Platelets: 192 10*3/uL (ref 150–400)
RBC: 4.88 MIL/uL (ref 4.22–5.81)
RDW: 13.8 % (ref 11.5–15.5)
WBC: 9.5 10*3/uL (ref 4.0–10.5)
nRBC: 0 % (ref 0.0–0.2)

## 2018-12-14 LAB — TROPONIN I (HIGH SENSITIVITY)
Troponin I (High Sensitivity): 6 ng/L (ref <18)
Troponin I (High Sensitivity): 6 ng/L (ref <18)

## 2018-12-14 LAB — SARS CORONAVIRUS 2 BY RT PCR (HOSPITAL ORDER, PERFORMED IN ~~LOC~~ HOSPITAL LAB): SARS Coronavirus 2: NEGATIVE

## 2018-12-14 LAB — CBG MONITORING, ED: Glucose-Capillary: 131 mg/dL — ABNORMAL HIGH (ref 70–99)

## 2018-12-14 MED ORDER — ISOSORBIDE MONONITRATE ER 30 MG PO TB24: 30.0000 mg | ORAL_TABLET | Freq: Every day | ORAL | Status: DC

## 2018-12-14 MED ORDER — SODIUM CHLORIDE 0.9 % IV SOLN: 250.0000 mL | INTRAVENOUS | Status: DC | PRN

## 2018-12-14 MED ORDER — APIXABAN 2.5 MG PO TABS
2.5000 mg | ORAL_TABLET | Freq: Two times a day (BID) | ORAL | Status: DC
  Administered 2018-12-14 – 2018-12-15 (×2): 2.5 mg via ORAL
  Filled 2018-12-14 (×2): qty 1

## 2018-12-14 MED ORDER — ONDANSETRON HCL 4 MG/2ML IJ SOLN: 4.0000 mg | Freq: Four times a day (QID) | INTRAMUSCULAR | Status: DC | PRN

## 2018-12-14 MED ORDER — POLYETHYLENE GLYCOL 3350 17 G PO PACK: 17.0000 g | PACK | Freq: Every day | ORAL | Status: DC | PRN

## 2018-12-14 MED ORDER — TAMSULOSIN HCL 0.4 MG PO CAPS: 0.4000 mg | ORAL_CAPSULE | Freq: Every day | ORAL | Status: DC

## 2018-12-14 MED ORDER — ACETAMINOPHEN 325 MG PO TABS: 650.0000 mg | ORAL_TABLET | Freq: Four times a day (QID) | ORAL | Status: DC | PRN

## 2018-12-14 MED ORDER — LUBIPROSTONE 8 MCG PO CAPS
8.0000 ug | ORAL_CAPSULE | Freq: Two times a day (BID) | ORAL | Status: DC
  Administered 2018-12-15: 8 ug via ORAL
  Filled 2018-12-14 (×3): qty 1

## 2018-12-14 MED ORDER — DILTIAZEM HCL ER COATED BEADS 120 MG PO CP24: 120.0000 mg | ORAL_CAPSULE | Freq: Every day | ORAL | Status: DC

## 2018-12-14 MED ORDER — ALUM & MAG HYDROXIDE-SIMETH 200-200-20 MG/5ML PO SUSP: 30.0000 mL | Freq: Four times a day (QID) | ORAL | Status: DC | PRN

## 2018-12-14 MED ORDER — HYDROCHLOROTHIAZIDE 12.5 MG PO CAPS: 12.5000 mg | ORAL_CAPSULE | ORAL | Status: DC

## 2018-12-14 MED ORDER — ACETAMINOPHEN 650 MG RE SUPP: 650.0000 mg | Freq: Four times a day (QID) | RECTAL | Status: DC | PRN

## 2018-12-14 MED ORDER — SODIUM CHLORIDE 0.9 % IV BOLUS
500.0000 mL | Freq: Once | INTRAVENOUS | Status: AC
  Administered 2018-12-14: 500 mL via INTRAVENOUS

## 2018-12-14 MED ORDER — SODIUM CHLORIDE 0.9% FLUSH: 3.0000 mL | INTRAVENOUS | Status: DC | PRN

## 2018-12-14 MED ORDER — ONDANSETRON HCL 4 MG PO TABS: 4.0000 mg | ORAL_TABLET | Freq: Four times a day (QID) | ORAL | Status: DC | PRN

## 2018-12-14 MED ORDER — SODIUM CHLORIDE 0.9% FLUSH
3.0000 mL | Freq: Two times a day (BID) | INTRAVENOUS | Status: DC
  Administered 2018-12-15: 3 mL via INTRAVENOUS

## 2018-12-14 MED ORDER — LEVOTHYROXINE SODIUM 75 MCG PO TABS
150.0000 ug | ORAL_TABLET | Freq: Every day | ORAL | Status: DC
  Administered 2018-12-15: 150 ug via ORAL
  Filled 2018-12-14: qty 2

## 2018-12-14 MED ORDER — SODIUM CHLORIDE 0.9% FLUSH: 3.0000 mL | Freq: Two times a day (BID) | INTRAVENOUS | Status: DC

## 2018-12-14 MED ORDER — FINASTERIDE 5 MG PO TABS
2.5000 mg | ORAL_TABLET | Freq: Every day | ORAL | Status: DC
  Administered 2018-12-14: 2.5 mg via ORAL
  Filled 2018-12-14 (×2): qty 0.5

## 2018-12-14 MED ORDER — CARVEDILOL 3.125 MG PO TABS
3.1250 mg | ORAL_TABLET | Freq: Two times a day (BID) | ORAL | Status: DC
  Administered 2018-12-14 – 2018-12-15 (×2): 3.125 mg via ORAL
  Filled 2018-12-14 (×2): qty 1

## 2018-12-14 NOTE — H&P (Signed)
History and Physical    Vincent FraterHassell L Mercier Sr. ZOX:[id number] DOB: September 27, 1922 DOA: 12/14/2018  PCP: Joycelyn RuaMeyers, Stephen, MD  Patient coming from: Home via EMS  I have personally briefly reviewed patient's [facility]  Chief Complaint:Syncope at home this morning  HPI: Vincent CashHassell L Strome Sr. is a 85 y.o. male with medical history significant of atrial fibrillation on Eliquis, benign prostatic hyperplasia, coronary artery disease, dysuria, hyperlipidemia, hypertension, coronary artery disease, and chronic kidney disease stage III presents emergency department today with an episode of syncope.  [facility] and family stated he was sitting at the table and had a witnessed syncopal event while sitting down.  The patient was lowered to the floor upon the request of 911 and woke up within 5 minutes.  He has had some similar episodes but have not lasted this long.  The event was not a sudden event but actually came on slowly the patient usually sits with his head leaning forward and he was leaning forward for longer than expected and became less responsive and then passed out.  Patient is alert and oriented today but is vague about the events and unable to give a clear history.  He denies any pain or injury to his body.  Has no chest pain no dyspnea.  He has no cough or sputum production.  His biggest concern is that he has a dry mouth at night and does not sleep well in the past few nights because of the dry mouth.  He also states that he has a poor appetite and this has been present for quite some time.  On discussion with his daughter-in-law she says although he states he has a poor appetite he is able to clean his plate at home.  Patient states that he has to force himself to eat.  He has not had any recent medication changes.  No history obtained from his daughter-in-law [name] states that the patient was sitting at the table this morning as he  usually does he always keeps his head down while he was eating.  She noted that he seemed less responsive and slowed his words upon questioning.  Over several minutes he became much less responsive and then his eyes rolled back in his head.  She called 911 and thought that he had stopped breathing.  They instructed her to ease him to the floor which she and her husband did.  By the time he was back on the floor he started to respond and seemed to be breathing acceptably.  Not noticed any other changes.  Patient has a history of a syncopal episode in 2017.  Of his advanced directive from our media section in the computer shows that he is a DO NOT RESUSCITATE.  His power of attorney according to his paperwork is his son.  Primary care physician is Dr. Daphane ShepherdMeyer, cardiologist is Dr. Jens Somrenshaw.  Cardiology has been consulted by ED.  ED Course: Was fully evaluated.  Labs are unremarkable.  EKG shows some diffuse nonspecific ST-T wave abnormalities which are different and new from very of this year.  His troponins have been negative.  Patient has returned back to his baseline.  Review of Systems: As per HPI otherwise all other systems reviewed and  negative.   Past Medical History:  Diagnosis Date  . Arthritis   . Atrial fibrillation (HCC)   . BPH (benign prostatic hyperplasia)   . CAD (coronary artery  disease)   . Dysuria   . GERD (gastroesophageal reflux disease)   . HLD (hyperlipidemia)   . HTN (hypertension)   . Hypothyroid     Past Surgical History:  Procedure Laterality Date  . Collapsed Lung     x 2  . THYROID SURGERY    . TRANSURETHRAL RESECTION OF PROSTATE      Social History   Social History Narrative  . Not on file     reports that he has quit smoking. He has quit using smokeless tobacco.  His smokeless tobacco use included chew. He reports that he does not drink alcohol or use drugs.  No Known Allergies  Family History  Problem Relation Age of Onset  . Kidney disease Father    . Bladder Cancer Father      Prior to Admission medications   Medication Sig Start Date End Date Taking? Authorizing Provider  carvedilol (COREG) 6.25 MG tablet Take 6.25 mg by mouth 2 (two) times daily with a meal.    Yes [provider]  diltiazem (CARDIZEM CD) 120 MG 24 hr capsule Take 1 capsule (120 mg total) by mouth daily. 04/07/16  Yes Lewayne Buntingrenshaw, Brian S, MD  ELIQUIS 2.5 MG TABS tablet Take 1 tablet by mouth twice daily Patient taking differently: Take 2.5 mg by mouth 2 (two) times daily.  11/01/18  Yes Lewayne Buntingrenshaw, Brian S, MD  finasteride (PROSCAR) 5 MG tablet Take 5 mg by mouth at bedtime.    Yes [provider]  hydrochlorothiazide (MICROZIDE) 12.5 MG capsule TAKE 1 CAPSULE BY MOUTH MONDAY,WEDNESDAY,AND FRIDAY Patient taking differently: Take 12.5 mg by mouth every Monday, Wednesday, and Friday.  06/20/18  Yes Lewayne Buntingrenshaw, Brian S, MD  isosorbide mononitrate (IMDUR) 60 MG 24 hr tablet Take 1 tablet by mouth once daily Patient taking differently: Take 60 mg by mouth daily.  11/01/18  Yes Lewayne Buntingrenshaw, Brian S, MD  levothyroxine (SYNTHROID, LEVOTHROID) 150 MCG tablet Take 150 mcg by mouth daily before breakfast.  01/07/15  Yes [provider]  lubiprostone (AMITIZA) 8 MCG capsule Take 8 mcg by mouth 2 (two) times daily with a meal.   Yes [provider]  simvastatin (ZOCOR) 80 MG tablet TAKE 1/2 (ONE-HALF) TABLET BY MOUTH ONCE DAILY AT  6  PM Patient taking differently: Take 40 mg by mouth at bedtime.  10/13/18  Yes Lewayne Buntingrenshaw, Brian S, MD  tamsulosin (FLOMAX) 0.4 MG CAPS capsule Take 1 capsule (0.4 mg total) by mouth daily. Patient taking differently: Take 0.4 mg by mouth at bedtime.  01/11/15  Yes McGowan, Wellington HampshireShannon A, PA-C    Physical Exam:  Constitutional: NAD, calm, comfortable, well-developed well-nourished elderly male in no acute respiratory distress Vitals:   12/14/18 1100 12/14/18 1117 12/14/18 1300 12/14/18 1315  BP: 135/78 133/72 119/76 104/79  Pulse:  69 90 64 68  Resp: 16 20 12 18   Temp: 98 F (36.7 C)     TempSrc: Oral     SpO2: 96% 98% 95% 96%  Weight: 68 kg     Height: 5\' 8"  (1.727 m)      Eyes: PERRL, lids and conjunctivae normal, arcus senilis noted. ENMT: Mucous membranes are dry. Posterior pharynx clear of any exudate or lesions.Normal dentition.  Neck: normal, supple, no masses, no thyromegaly Respiratory: clear to auscultation bilaterally, no wheezing, no crackles. Normal respiratory effort. No accessory muscle use.  Cardiovascular: Irregularly irregular rate and rhythm, no murmurs / rubs / gallops. No extremity edema. 2+ pedal pulses. No carotid bruits.  Abdomen: no tenderness, no masses palpated. No hepatosplenomegaly. Bowel sounds positive.  Musculoskeletal: no clubbing / cyanosis. No joint deformity upper and lower extremities. Good ROM, no contractures. Normal muscle tone.  Skin: no rashes, lesions, ulcers. No induration Neurologic: CN 2-12 grossly intact. Sensation intact, DTR normal. Strength 5/5 in all 4.  Psychiatric: Normal judgment and insight. Alert and oriented x 3. Normal mood.    Labs on Admission: I have personally reviewed following labs and imaging studies  CBC: Recent Labs  Lab 12/14/18 1119  WBC 9.5  NEUTROABS 6.8  HGB 15.2  HCT 46.2  MCV 94.7  PLT 737   Basic Metabolic Panel: Recent Labs  Lab 12/14/18 1119  NA 137  K 3.9  CL 103  CO2 23  GLUCOSE 124*  BUN 19  CREATININE 1.67*  CALCIUM 9.4   GFR: Estimated Creatinine Clearance: 26 mL/min (A) (by C-G formula based on SCr of 1.67 mg/dL (H)). Liver Function Tests: Recent Labs  Lab 12/14/18 1119  AST 27  ALT 20  ALKPHOS 60  BILITOT 1.2  PROT 7.2  ALBUMIN 4.0   CBG: Recent Labs  Lab 12/14/18 1128  GLUCAP 131*   Urine analysis:    Component Value Date/Time   COLORURINE YELLOW 03/18/2015 0945   APPEARANCEUR CLEAR 03/18/2015 0945   APPEARANCEUR Clear 01/11/2015 1408   LABSPEC 1.010 03/18/2015 0945   LABSPEC 1.009  03/22/2013 0717   PHURINE 7.0 03/18/2015 0945   GLUCOSEU NEGATIVE 03/18/2015 0945   GLUCOSEU Negative 03/22/2013 0717   HGBUR NEGATIVE 03/18/2015 0945   BILIRUBINUR NEGATIVE 03/18/2015 0945   BILIRUBINUR Negative 01/11/2015 1408   BILIRUBINUR Negative 03/22/2013 0717   KETONESUR 15 (A) 03/18/2015 0945   PROTEINUR NEGATIVE 03/18/2015 0945   UROBILINOGEN 0.2 03/18/2015 0945   NITRITE NEGATIVE 03/18/2015 0945   LEUKOCYTESUR NEGATIVE 03/18/2015 0945   LEUKOCYTESUR Negative 01/11/2015 1408   LEUKOCYTESUR Negative 03/22/2013 0717    Radiological Exams on Admission: Ct Head Wo Contrast  Result Date: 12/14/2018 CLINICAL DATA:  Altered level of consciousness/syncope EXAM: CT HEAD WITHOUT CONTRAST TECHNIQUE: Contiguous axial images were obtained from the base of the skull through the vertex without intravenous contrast. COMPARISON:  September 27, 2015 FINDINGS: Brain: There is mild to moderate diffuse atrophy, stable. There is no intracranial mass 11, hemorrhage, extra-axial fluid collection, or midline shift. There is patchy small vessel disease in the centra semiovale bilaterally. There is no demonstrable acute infarct. Vascular: There is no hyperdense vessel. There is calcification in the left carotid siphon region. Skull: The bony calvarium appears intact. Sinuses/Orbits: There is mucosal thickening in several ethmoid air cells. Other visualized paranasal sinuses are clear. Orbits appear symmetric bilaterally. Other: Mastoid air cells are clear. IMPRESSION: Atrophy with patchy supratentorial small vessel disease. No acute infarct evident. No mass or hemorrhage. There are foci of arterial vascular calcification. There is mucosal thickening in several ethmoid air cells. Electronically Signed   By: Lowella Grip III M.D.   On: 12/14/2018 12:30   Dg Chest Port 1 View  Result Date: 12/14/2018 CLINICAL DATA:  Syncope. EXAM: PORTABLE CHEST 1 VIEW COMPARISON:  Radiographs of September 27, 2015. FINDINGS: The  heart size and mediastinal contours are within normal limits. Atherosclerosis of thoracic aorta is noted. No pneumothorax or pleural effusion is noted. Right lung is clear. Stable minimal left basilar scarring is noted. The visualized skeletal structures are unremarkable. IMPRESSION: Stable probable minimal left basilar scarring. No acute abnormality is noted. Aortic Atherosclerosis (ICD10-I70.0). Electronically Signed   By: Jeneen Rinks  Christen ButterGreen Jr M.D.   On: 12/14/2018 11:36    EKG: Independently reviewed.  Rate controlled atrial fibrillation with occasional flutter waves.  New nonspecific ST-T wave abnormalities when compared to February 2020.  Assessment/Plan Principal Problem:   Syncope Active Problems:   Hypertension   Orthostatic hypotension   Anorexia   BPH with obstruction/lower urinary tract symptoms   Paroxysmal atrial fibrillation (HCC)   Coronary artery disease   CKD (chronic kidney disease), stage III (HCC)   Dysuria   Hyperlipidemia   Hypothyroidism    1.  Syncope with associated orthostatic hypotension: Patient is on multiple medications including carvedilol, diltiazem, finasteride, hydrochlorothiazide, Imdur, and tamsulosin all of which can cause use with maintenance of adequate blood pressure.  In this 85 year old gentleman I feel that his blood pressure may be a bit too low.  He probably should be running blood pressures closer to the 140-145 systolic range.  When he stood up his blood pressure dropped to 107 from 130.  He may have outlived the necessity for some of his antihypertensive medications.  I am going to cut his carvedilol from 6.25-3.125 p.o. twice daily, I am going to decrease his Imdur from 60 mg daily to 30 mg daily, I am going to decrease his Proscar 2.5 mg at bedtime as it too can cause orthostatic hypotension.  We will give patient a small amount of fluid here in the emergency department and allow him normal fluid intake.  He follows a regular diet at home and I am  going to continue that.  2.  Hypertension: As noted above I feel he may be on too many medications that lower his blood pressure.  Please see notes above.  3.  Anorexia: Patient is rather disturbed by his feeling of anorexia.  While he is able to eat everything that is presented to him he says he just does not have an appetite.  I suspect an underlying abdominal discomfort perhaps related to his simvastatin which can cause some appetite issues.  At this point as he has never had a myocardial infarction but does have a history of coronary disease I feel the simvastatin risks may outweigh its benefits.  I am going to discontinue the simvastatin.  Would monitor his appetite off the simvastatin and see how he does.  4.  BPH with lower urinary tract symptoms: Patient son reports that he tries to limit fluids at home because he does not want to urinate at night.  I would encourage nursing staff and inpatient staff to discuss with the patient the need to drink normally until 7 PM and then stop drinking liquids at that point.  I believe that despite monitoring his liquid intake patient would still have nocturia due to severity of his BPH.  Patient thinks by limiting his fluid he will decrease his nocturia which is not likely the case.  As noted above I have decreased his Proscar due to orthostatic hypotension  5.  Paroxysmal atrial fibrillation: Patient is currently anticoagulated with Eliquis.  He is on diltiazem 120 mg a day for rate control.  I have elected not to decrease his diltiazem dosing and have chosen to decrease other medications which will hopefully not affect his heart rate as much.  6.  Coronary artery disease: Continue current medications which include carvedilol at a lower dose, hydrochlorothiazide, Imdur.  Simvastatin is discontinued due to appetite issues.  7.  Kidney disease stage III: Creatinine is at baseline we will continue to monitor and avoid nephrotoxic  agents.  8.  Dysuria: Patient  states that this is a chronic problem and has not changed recently this is noted.  Analysis is pending.  9.  Hyperlipidemia: This is noted however at this point I feel patient's benefit from simvastatin outweighed by its risk and I have discontinued simvastatin.  10.  Hypothyroidism: We will continue Synthroid.   DVT prophylaxis: Eliquis Code Status: Do not attempt resuscitation Family Communication: I personally spoke with the patient's son Nedra Hai and his daughter-in-law Kriste Basque regarding his case in depth.  They were updated on his condition and plans. Disposition Plan: Likely home in 24 hours Consults called: Cardiology consult pursued by emergency department staff. Admission status: Observation It is my clinical opinion that referral for OBSERVATION is reasonable and necessary in this patient based on the above information provided. The aforementioned taken together are felt to place the patient at high risk for further clinical deterioration. However it is anticipated that the patient may be medically stable for discharge from the hospital within 24 to 48 hours.   Lahoma Crocker MD FACP [facility] Pager [phone]  How to contact the [facility] Attending or Consulting provider 7A - 7P or covering provider during after hours 7P -7A, for this patient?  1. Check the care team in [facility] and look for a) attending/consulting [facility] provider listed and b) the [facility] team listed 2. Log into [email or web address] and use [facility]'s universal password to access. If you do not have the password, please contact the hospital operator. 3. Locate the [facility] provider you are looking for under [facility] and page to a number that you can be directly reached. 4. If you still have difficulty reaching the provider, please page the [facility] (Director on Call) for the Hospitalists listed on amion for assistance.  If 7PM-7AM, please contact night-coverage [email or web address] Password [email or web address]  12/14/2018, 2:07 PM

## 2018-12-14 NOTE — ED Triage Notes (Signed)
Pt arrived by [facility] from home. Family reports that pt was sitting at the table and had witnessed syncopal episode while sitting down. They lowered him down to floor and he woke up within 5 mins. They reported hx of same but that this episode lasted longer than his norm. Pt is a&o on arrival.

## 2018-12-14 NOTE — ED Notes (Signed)
97.7 rectal temp 

## 2018-12-14 NOTE — Progress Notes (Signed)
Palliative Medicine RN Note: Consult order noted for ACP. PMT will likely not be able to see Vincent Mendez until tomorrow, and there are ACP documents in the media section of his chart (scanned in 03/20/15; signed 05/08/1997). HCPOA and advance directives are laid out in this paperwork.  Spoke with Dr Evangeline Gula; she recommended a deeper dive into ACP and requested PMT assist with MOST form/planning. Patient currently has capacity per her assessment, so we will discuss it with him when we have an available provider.  Vincent Skiff Jahquez Steffler, RN, BSN, [facility] Palliative Medicine Team 12/14/2018 2:15 PM Office [phone]

## 2018-12-14 NOTE — ED Provider Notes (Addendum)
[facility] Provider Note   CSN: [id number] Arrival date & time: 12/14/18  1048     History   Chief Complaint Chief Complaint  Vincent Mendez presents with  . Loss of Consciousness    HPI Vincent CashHassell L Flaugher Sr. is a 85 y.o. male.     HPI Vincent Mendez seen and evaluated on arrival to ED with nurse and tech 85 year old male presents today with reports of syncopal episode.  Triage nursing note states that [facility]  Family reports that Vincent Mendez was sitting at table had a witnessed syncopal episode while sitting down.  Reportedly lowered Vincent Mendez to floor and he woke up within 5 minutes.  There is reportedly has had some similar episodes but have not lasted this long.  Vincent Mendez is awake and alert and oriented.  However, he is vague about the events of today and unable to give a clear history.  He denies injuring any part of his body.  He denies having chest pain or dyspnea.  He denies any current pain.  He states that he has dry mouth at night has not slept very well the past several nights.  He states he not any breakfast and had a poor appetite, but feels that this is normal for him.  He denies any recent medication changes. Additional history obtained from daughter-in-law Vincent Mendez who was also present with his son Vincent Mendez during the call.  She states that he was at the table this morning.  She states he always keeps his head down while he was eating.  She noted that he seemed less responsive and slowed his words upon questioning.  Over several minutes he became less responsive.  His eyes rolled back in his head.  She called 911.  She thought that he had quit breathing.  They instructed her to ease him to the floor which they did.  By the time he was back on the floor he started to respond and seemed to be breathing okay.  She had not noticed any other changes.  He had a syncopal episode in 2017.  She states that she thinks he is a DNR but  she will check on this.  She, Vincent Mendez, the daughter-in-law, and we are both power of attorney.  Primary care is Dr. Fatima SangerStephen Meyer and cardiologist is Dr. Jens Somrenshaw Past Medical History:  Diagnosis Date  . Arthritis   . Atrial fibrillation (HCC)   . BPH (benign prostatic hyperplasia)   . CAD (coronary artery disease)   . Dysuria   . GERD (gastroesophageal reflux disease)   . HLD (hyperlipidemia)   . HTN (hypertension)   . Hypothyroid     Vincent Mendez Active Problem List   Diagnosis Date Noted  . COPD (chronic obstructive pulmonary disease) (HCC) 03/19/2015  . Syncope 03/18/2015  . Paroxysmal atrial fibrillation (HCC) 03/18/2015  . Coronary artery disease 03/18/2015  . Anemia 03/18/2015  . CKD (chronic kidney disease), stage III (HCC) 03/18/2015  . Hyperlipidemia 03/18/2015  . Hypertension 03/18/2015  . Hypothyroidism 03/18/2015  . Chronic constipation 03/18/2015  . B12 deficiency 03/18/2015  . Dysuria 01/13/2015  . BPH with obstruction/lower urinary tract symptoms 01/13/2015    Past Surgical History:  Procedure Laterality Date  . Collapsed Lung     x 2  . THYROID SURGERY    . TRANSURETHRAL RESECTION OF PROSTATE          Home Medications    Prior to Admission medications   Medication Sig  Start Date End Date Taking? Authorizing Provider  carvedilol (COREG) 6.25 MG tablet Take by mouth.    [provider]  diltiazem (CARDIZEM CD) 120 MG 24 hr capsule Take 1 capsule (120 mg total) by mouth daily. 04/07/16   Lelon Perla, MD  ELIQUIS 2.5 MG TABS tablet Take 1 tablet by mouth twice daily 11/01/18   Lelon Perla, MD  finasteride (PROSCAR) 5 MG tablet Take 5 mg by mouth daily.     [provider]  hydrochlorothiazide (MICROZIDE) 12.5 MG capsule TAKE 1 CAPSULE BY MOUTH MONDAY,WEDNESDAY,AND FRIDAY 06/20/18   Lelon Perla, MD  isosorbide mononitrate (IMDUR) 60 MG 24 hr tablet Take 1 tablet by mouth once daily 11/01/18   Lelon Perla, MD  levothyroxine  (SYNTHROID, LEVOTHROID) 150 MCG tablet Take 150 mcg by mouth daily before breakfast.  01/07/15   [provider]  lubiprostone (AMITIZA) 8 MCG capsule Take 8 mcg by mouth 2 (two) times daily with a meal.    [provider]  omeprazole (PRILOSEC) 20 MG capsule Take 20 mg by mouth daily.  08/20/14   [provider]  oxymetazoline (AFRIN) 0.05 % nasal spray Place 2 sprays into both nostrils as needed (for nosebleeds). If nose starts bleeding again placed 2 sprays in each nostril and then hold pressure for at least 15 minutes 12/24/17   Jacqlyn Larsen, PA-C  simvastatin (ZOCOR) 80 MG tablet TAKE 1/2 (ONE-HALF) TABLET BY MOUTH ONCE DAILY AT  6  PM 10/13/18   Lelon Perla, MD  tamsulosin (FLOMAX) 0.4 MG CAPS capsule Take 1 capsule (0.4 mg total) by mouth daily. 01/11/15   Nori Riis, PA-C    Family History Family History  Problem Relation Age of Onset  . Kidney disease Father   . Bladder Cancer Father     Social History Social History   Tobacco Use  . Smoking status: Former Research scientist (life sciences)  . Smokeless tobacco: Former Systems developer    Types: Chew  Substance Use Topics  . Alcohol use: No    Alcohol/week: 0.0 standard drinks  . Drug use: No     Allergies   Vincent Mendez has no known allergies.   Review of Systems Review of Systems  Constitutional: Negative.   HENT:       Dry mouth  Eyes: Negative.   Respiratory: Negative.   Cardiovascular: Negative.   Gastrointestinal: Negative.   Endocrine: Negative.   Genitourinary: Positive for frequency.  Musculoskeletal: Negative.   Skin: Negative.   Allergic/Immunologic: Negative.   Neurological: Positive for light-headedness.  Hematological: Negative.   Psychiatric/Behavioral: Negative.   All other systems reviewed and are negative.    Physical Exam Updated Vital Signs BP 135/78 (BP Location: Right Arm)   Pulse 69   Temp 98 F (36.7 C) (Oral)   Resp 16   Ht 1.727 m (5\' 8" )   Wt 68 kg   SpO2 96%   BMI 22.81  kg/m   Physical Exam Vitals signs and nursing note reviewed. Exam conducted with a chaperone present.  Constitutional:      General: He is not in acute distress.    Appearance: Normal appearance. He is normal weight. He is not ill-appearing.  HENT:     Head: Normocephalic and atraumatic.     Right Ear: External ear normal.     Left Ear: External ear normal.     Nose: Nose normal.     Mouth/Throat:     Mouth: Mucous membranes are moist.  Eyes:     Extraocular Movements: Extraocular movements intact.     Conjunctiva/sclera: Conjunctivae normal.     Pupils: Pupils are equal, round, and reactive to light.  Neck:     Musculoskeletal: Normal range of motion and neck supple.  Cardiovascular:     Rate and Rhythm: Rhythm irregular.     Pulses: Normal pulses.     Heart sounds: Normal heart sounds.  Pulmonary:     Effort: Pulmonary effort is normal.     Breath sounds: Normal breath sounds.  Abdominal:     General: Abdomen is flat. Bowel sounds are normal. There is no distension.     Palpations: There is no mass.  Genitourinary:    Penis: Normal.   Musculoskeletal: Normal range of motion.        General: No swelling, tenderness, deformity or signs of injury.     Right lower leg: No edema.     Left lower leg: No edema.  Skin:    General: Skin is warm and dry.     Capillary Refill: Capillary refill takes less than 2 seconds.     Findings: No bruising, lesion or rash.  Neurological:     General: No focal deficit present.     Mental Status: He is alert and oriented to person, place, and time.     Cranial Nerves: No cranial nerve deficit.     Sensory: No sensory deficit.     Motor: No weakness.     Coordination: Coordination normal.     Deep Tendon Reflexes: Reflexes normal.  Psychiatric:        Mood and Affect: Mood normal.      ED Treatments / Results  Labs (all labs ordered are listed, but only abnormal results are displayed) Labs Reviewed  CBC WITH DIFFERENTIAL/PLATELET   COMPREHENSIVE METABOLIC PANEL  URINALYSIS, ROUTINE W REFLEX MICROSCOPIC  TROPONIN I (HIGH SENSITIVITY)  TROPONIN I (HIGH SENSITIVITY)  CBG MONITORING, ED    EKG EKG Interpretation  Date/Time:  Wednesday December 14 2018 10:56:47 EDT Ventricular Rate:  73 PR Interval:    QRS Duration: 70 QT Interval:  366 QTC Calculation: 403 R Axis:   79 Text Interpretation:  Atrial fibrillation Nonspecific ST abnormality Abnormal ECG Non-specific ST-t changes new from first prior ekg of 27 September 2015 Confirmed by Margarita Grizzleay, Amneet Cendejas [phone]([id number]) on 12/14/2018 11:07:21 AM   Radiology Ct Head Wo Contrast  Result Date: 12/14/2018 CLINICAL DATA:  Altered level of consciousness/syncope EXAM: CT HEAD WITHOUT CONTRAST TECHNIQUE: Contiguous axial images were obtained from the base of the skull through the vertex without intravenous contrast. COMPARISON:  September 27, 2015 FINDINGS: Brain: There is mild to moderate diffuse atrophy, stable. There is no intracranial mass 11, hemorrhage, extra-axial fluid collection, or midline shift. There is patchy small vessel disease in the centra semiovale bilaterally. There is no demonstrable acute infarct. Vascular: There is no hyperdense vessel. There is calcification in the left carotid siphon region. Skull: The bony calvarium appears intact. Sinuses/Orbits: There is mucosal thickening in several ethmoid air cells. Other visualized paranasal sinuses are clear. Orbits appear symmetric bilaterally. Other: Mastoid air cells are clear. IMPRESSION: Atrophy with patchy supratentorial small vessel disease. No acute infarct evident. No mass or hemorrhage. There are foci of arterial vascular calcification. There is mucosal thickening in several ethmoid air cells. Electronically Signed   By: Bretta BangWilliam  Woodruff III M.D.   On: 12/14/2018 12:30   Dg Chest Port 1 View  Result Date: 12/14/2018 CLINICAL DATA:  Syncope.  EXAM: PORTABLE CHEST 1 VIEW COMPARISON:  Radiographs of September 27, 2015. FINDINGS: The heart  size and mediastinal contours are within normal limits. Atherosclerosis of thoracic aorta is noted. No pneumothorax or pleural effusion is noted. Right lung is clear. Stable minimal left basilar scarring is noted. The visualized skeletal structures are unremarkable. IMPRESSION: Stable probable minimal left basilar scarring. No acute abnormality is noted. Aortic Atherosclerosis (ICD10-I70.0). Electronically Signed   By: Lupita RaiderJames  Green Jr M.D.   On: 12/14/2018 11:36    Procedures .Critical Care Performed by: Margarita Grizzleay, Braylinn Gulden, MD Authorized by: Margarita Grizzleay, Chrisotpher Rivero, MD   Critical care provider statement:    Critical care time (minutes):  45   Critical care end time:  12/14/2018 1:27 PM   Critical care was time spent personally by me on the following activities:  Discussions with consultants, evaluation of Vincent Mendez's response to treatment, examination of Vincent Mendez, ordering and performing treatments and interventions, ordering and review of laboratory studies, ordering and review of radiographic studies, pulse oximetry, re-evaluation of Vincent Mendez's condition, obtaining history from Vincent Mendez or surrogate and review of old charts   (including critical care time)  Medications Ordered in ED Medications - No data to display   Initial Impression / Assessment and Plan / ED Course  I have reviewed the triage vital signs and the nursing notes.  Pertinent labs & imaging results that were available during my care of the Vincent Mendez were reviewed by me and considered in my medical decision making (see chart for details).       1:02 PM Labs reviewed Awaiting troponin Ct head and chest reviewed wihtout acute abnormality Vincent Mendez  Discussed with Dr. Willette PaSheehan- noted orthostatic changes and ns 500 cc ordered  Cardiology paged  Episode of altered mental status/syncope ? Not breathing DDX- cardiac etiology-nonsustained vt tach- or bradycardia-Vincent Mendez with normal rate and rhythm here, troponin normal on initial labs Seizure vs  other neurologic event- Vincent Mendez did not have obvious seizure or stroke symptoms - normal neuro exam here Metabolic- hypoglycemia/other metabolic abnormality- bs normal here, sodium 137 CBC normal, bp normal although orhtostatic  1:33 PM Discussed with Trish- cardiology will see in  Consult.  Final Clinical Impressions(s) / ED Diagnoses   Final diagnoses:  Syncope and collapse    ED Discharge Orders    None       Margarita Grizzleay, Corene Resnick, MD 12/14/18 1327    Margarita Grizzleay, Lindie Roberson, MD 12/14/18 [phone]

## 2018-12-14 NOTE — Consult Note (Signed)
Cardiology Consultation:   Patient ID: TRAXTON KOLENDA Sr.; [id number]; [birth year]   Admit date: 12/14/2018 Date of Consult: 12/14/2018  Primary Care Provider: Joycelyn Rua, MD Primary Cardiologist: Olga Millers, MD Primary Electrophysiologist:  None    Patient Profile:   Vincent Cash Sr. is a 85 y.o. male with a PMH of CAD, paroxysmal atrial fibrillation, syncope felt to be 2/2 micturition, HTN, HLD, BPH, hypothyroidism, and CKD stage 3, who is being seen today for the evaluation of syncope at the request of Dr. Willette Pa.  History of Present Illness:   Vincent Mendez was in his usual state of health until this morning when he experienced a syncopal episode. The event was witnessed by family. He was sitting at the table when he slowly leaned forward and subsequently lost consciousness. He reportedly woke up after being lowered to the ground; total LOC <5 minutes. He is reported to have had similar episodes in the past which typically do not last this long. He denied any prodromal symptoms. He has a poor appetite but no significant change in recent months.   He was last evaluated by cardiology at an outpatient visit with Dr. Jens Som 07/2018, at which time he was without cardiac complaints and was tolerating apixaban without complications. He was recommended to continue diltiazem and carvedilol for rate control and apixaban for stroke ppx. His last echocardiogram was in 2016 and showed EF 60-65%, no RWMA, no significant valvular abnormalities. Last ischemic evaluation was a remote catheterization 15-20 years ago with unknown findings (no prior stents noted so presumed non-obstructive CAD).   Hospital course: VSS with isolated documentation of HTN. Labs notable for electrolytes wnl, Cr 1.67 (baseline), CBC wnl, High sensitivity trop 6 x2. COVID-19 negative. CT Head with atrophy and small vessel disease without acute findings. EKG with atrial fibrillation, rate 73, non-specific ST-T wave  abnormalities, no significant change from previous. CXR without acute findings. He was noted to be orthostatic. Home medication doses decreased and he was given gentle hydration. Cardiology asked to evaluate for syncope.   The patient describes feeling weak at the dinner table after eating. He then leaned forward and had a 'fainting spell.' He describes 'not feeling well' as a prodrome, but denies nausea, diaphoresis, or chest pain. He has had similar episodes in the past, but denies having sudden syncope without prodrome. No chest pain, shortness of breath. Biggest complaint is dry mouth.  Past Medical History:  Diagnosis Date  . Arthritis   . Atrial fibrillation (HCC)   . BPH (benign prostatic hyperplasia)   . CAD (coronary artery disease)   . Dysuria   . GERD (gastroesophageal reflux disease)   . HLD (hyperlipidemia)   . HTN (hypertension)   . Hypothyroid   . Syncope 12/14/2018    Past Surgical History:  Procedure Laterality Date  . Collapsed Lung     x 2  . THYROID SURGERY    . TRANSURETHRAL RESECTION OF PROSTATE       Home Medications:  Prior to Admission medications   Medication Sig Start Date End Date Taking? Authorizing Provider  carvedilol (COREG) 6.25 MG tablet Take 6.25 mg by mouth 2 (two) times daily with a meal.    Yes [provider]  diltiazem (CARDIZEM CD) 120 MG 24 hr capsule Take 1 capsule (120 mg total) by mouth daily. 04/07/16  Yes Lewayne Bunting, MD  ELIQUIS 2.5 MG TABS tablet Take 1 tablet by mouth twice daily Patient taking differently: Take 2.5 mg by mouth  2 (two) times daily.  11/01/18  Yes Lelon Perla, MD  finasteride (PROSCAR) 5 MG tablet Take 5 mg by mouth at bedtime.    Yes [provider]  hydrochlorothiazide (MICROZIDE) 12.5 MG capsule TAKE 1 CAPSULE BY MOUTH MONDAY,WEDNESDAY,AND FRIDAY Patient taking differently: Take 12.5 mg by mouth every Monday, Wednesday, and Friday.  06/20/18  Yes Lelon Perla, MD  isosorbide  mononitrate (IMDUR) 60 MG 24 hr tablet Take 1 tablet by mouth once daily Patient taking differently: Take 60 mg by mouth daily.  11/01/18  Yes Lelon Perla, MD  levothyroxine (SYNTHROID, LEVOTHROID) 150 MCG tablet Take 150 mcg by mouth daily before breakfast.  01/07/15  Yes [provider]  lubiprostone (AMITIZA) 8 MCG capsule Take 8 mcg by mouth 2 (two) times daily with a meal.   Yes [provider]  simvastatin (ZOCOR) 80 MG tablet TAKE 1/2 (ONE-HALF) TABLET BY MOUTH ONCE DAILY AT  6  PM Patient taking differently: Take 40 mg by mouth at bedtime.  10/13/18  Yes Lelon Perla, MD  tamsulosin (FLOMAX) 0.4 MG CAPS capsule Take 1 capsule (0.4 mg total) by mouth daily. Patient taking differently: Take 0.4 mg by mouth at bedtime.  01/11/15  Yes McGowan, Hunt Oris, PA-C    Inpatient Medications: Scheduled Meds: . apixaban  2.5 mg Oral BID  . carvedilol  3.125 mg Oral BID WC  . [START ON 12/15/2018] diltiazem  120 mg Oral Daily  . finasteride  2.5 mg Oral QHS  . [START ON 12/16/2018] hydrochlorothiazide  12.5 mg Oral Q M,W,F  . [START ON 12/15/2018] isosorbide mononitrate  30 mg Oral Daily  . [START ON 12/15/2018] levothyroxine  150 mcg Oral QAC breakfast  . lubiprostone  8 mcg Oral BID WC  . sodium chloride flush  3 mL Intravenous Q12H  . sodium chloride flush  3 mL Intravenous Q12H  . [START ON 12/15/2018] tamsulosin  0.4 mg Oral QPC supper   Continuous Infusions: . sodium chloride     PRN Meds: sodium chloride, acetaminophen **OR** acetaminophen, alum & mag hydroxide-simeth, ondansetron **OR** ondansetron (ZOFRAN) IV, polyethylene glycol, sodium chloride flush  Allergies:   No Known Allergies  Social History:   Social History   Socioeconomic History  . Marital status: Married    Spouse name: Not on file  . Number of children: Not on file  . Years of education: Not on file  . Highest education level: Not on file  Occupational History  . Not on file  Social Needs   . Financial resource strain: Not on file  . Food insecurity    Worry: Not on file    Inability: Not on file  . Transportation needs    Medical: Not on file    Non-medical: Not on file  Tobacco Use  . Smoking status: Former Research scientist (life sciences)  . Smokeless tobacco: Former Systems developer    Types: Chew  Substance and Sexual Activity  . Alcohol use: No    Alcohol/week: 0.0 standard drinks  . Drug use: No  . Sexual activity: Not on file  Lifestyle  . Physical activity    Days per week: Not on file    Minutes per session: Not on file  . Stress: Not on file  Relationships  . Social Herbalist on phone: Not on file    Gets together: Not on file    Attends religious service: Not on file    Active member of club or organization: Not  on file    Attends meetings of clubs or organizations: Not on file    Relationship status: Not on file  . Intimate partner violence    Fear of current or ex partner: Not on file    Emotionally abused: Not on file    Physically abused: Not on file    Forced sexual activity: Not on file  Other Topics Concern  . Not on file  Social History Narrative  . Not on file    Family History:    Family History  Problem Relation Age of Onset  . Kidney disease Father   . Bladder Cancer Father      ROS:  Please see the history of present illness.   All other ROS reviewed and negative.     Physical Exam/Data:   Vitals:   12/14/18 1300 12/14/18 1315 12/14/18 1400 12/14/18 1530  BP: 119/76 104/79 115/84 (!) 163/90  Pulse: 64 68 72 72  Resp: 12 18 13 18   Temp:      TempSrc:      SpO2: 95% 96% 98% 98%  Weight:      Height:        Intake/Output Summary (Last 24 hours) at 12/14/2018 1803 Last data filed at 12/14/2018 1530 Gross per 24 hour  Intake 500 ml  Output -  Net 500 ml   Filed Weights   12/14/18 1100  Weight: 68 kg   Body mass index is 22.81 kg/m.  General:  Well nourished, well developed, elderly male in no acute distress HEENT: sclera anicteric   Lymph: no adenopathy Neck: no JVD Endocrine:  No thryomegaly Vascular: No carotid bruits; distal pulses 2+ bilaterally Cardiac:  Irregular no murmur Lungs:  clear to auscultation bilaterally, no wheezing, rhonchi or rales  Abd: NABS, soft, nontender, no hepatomegaly Ext: no edema Musculoskeletal:  No deformities, BUE and BLE strength normal and equal Skin: warm and dry  Neuro:  CNs 2-12 intact, no focal abnormalities noted Psych:  Normal affect   EKG:  The EKG was personally reviewed and demonstrates:  atrial fibrillation, rate 73, non-specific ST-T wave abnormalities, no significant change from previous.  Telemetry:  Telemetry was personally reviewed and demonstrates:  Atrial fibrillation with controlled ventricular response, no pathologic pauses  Relevant CV Studies: Echocardiogram 03/2015: Study Conclusions  - Left ventricle: The cavity size was normal. Wall thickness was   normal. Systolic function was normal. The estimated ejection   fraction was in the range of 60% to 65%. Wall motion was normal;   there were no regional wall motion abnormalities.  Laboratory Data:  Chemistry Recent Labs  Lab 12/14/18 1119  NA 137  K 3.9  CL 103  CO2 23  GLUCOSE 124*  BUN 19  CREATININE 1.67*  CALCIUM 9.4  GFRNONAA 35*  GFRAA 40*  ANIONGAP 11    Recent Labs  Lab 12/14/18 1119  PROT 7.2  ALBUMIN 4.0  AST 27  ALT 20  ALKPHOS 60  BILITOT 1.2   Hematology Recent Labs  Lab 12/14/18 1119  WBC 9.5  RBC 4.88  HGB 15.2  HCT 46.2  MCV 94.7  MCH 31.1  MCHC 32.9  RDW 13.8  PLT 192   Cardiac EnzymesNo results for input(s): TROPONINI in the last 168 hours. No results for input(s): TROPIPOC in the last 168 hours.  BNPNo results for input(s): BNP, PROBNP in the last 168 hours.  DDimer No results for input(s): DDIMER in the last 168 hours.  Radiology/Studies:  Ct Head Wo  Contrast  Result Date: 12/14/2018 CLINICAL DATA:  Altered level of consciousness/syncope EXAM: CT  HEAD WITHOUT CONTRAST TECHNIQUE: Contiguous axial images were obtained from the base of the skull through the vertex without intravenous contrast. COMPARISON:  September 27, 2015 FINDINGS: Brain: There is mild to moderate diffuse atrophy, stable. There is no intracranial mass 11, hemorrhage, extra-axial fluid collection, or midline shift. There is patchy small vessel disease in the centra semiovale bilaterally. There is no demonstrable acute infarct. Vascular: There is no hyperdense vessel. There is calcification in the left carotid siphon region. Skull: The bony calvarium appears intact. Sinuses/Orbits: There is mucosal thickening in several ethmoid air cells. Other visualized paranasal sinuses are clear. Orbits appear symmetric bilaterally. Other: Mastoid air cells are clear. IMPRESSION: Atrophy with patchy supratentorial small vessel disease. No acute infarct evident. No mass or hemorrhage. There are foci of arterial vascular calcification. There is mucosal thickening in several ethmoid air cells. Electronically Signed   By: Bretta BangWilliam  Woodruff III M.D.   On: 12/14/2018 12:30   Dg Chest Port 1 View  Result Date: 12/14/2018 CLINICAL DATA:  Syncope. EXAM: PORTABLE CHEST 1 VIEW COMPARISON:  Radiographs of September 27, 2015. FINDINGS: The heart size and mediastinal contours are within normal limits. Atherosclerosis of thoracic aorta is noted. No pneumothorax or pleural effusion is noted. Right lung is clear. Stable minimal left basilar scarring is noted. The visualized skeletal structures are unremarkable. IMPRESSION: Stable probable minimal left basilar scarring. No acute abnormality is noted. Aortic Atherosclerosis (ICD10-I70.0). Electronically Signed   By: Lupita RaiderJames  Green Jr M.D.   On: 12/14/2018 11:36    Assessment and Plan:   1. Syncope: patient has a history of syncope. Today's episode occurred while sitting at a table with gradual slumping and LOC for <5 minutes. No prodromal symptoms. Patient reported to have a  poor appetite. Also on multiple HTN medications and found to be orthostatic this admission. EKG with chronic Afib with CVR; non-ischemic. Trops are negative.  - Agree with reduced dosing of home antihypertensive regimen - Continue to monitor on telemetry   2. Atrial fibrillation: rate well controlled. No complaints of bleeding on apixaban (dose adjusted for age and Cr).  - Given orthostasis, could consider eliminating one of his AV nodal blocking agents (favor stopping diltiazem)  3. HTN: BP stable with the exception of one isolate reading of 163/90. Favor letting BP run a bit high to avoid hypotension with orthostasis.  - Agree with reduced dosing of home antihypertensive regimen.   4. CAD: presumably non-obstructive CAD on cath >15 years ago. No anginal complaints. Not on ASA given anticoagulation needs.  - Continue imdur and carvedilol - Okay with discontinuation of simvastatin in the elderly gentleman.   5. HLD: LDL 60 07/2018 - Okay with discontinuation of simvastatin in the elderly gentleman.    For questions or updates, please contact [facility] Please consult [email or web address] for contact info under Cardiology/STEMI.   Signed, Vincent StallionKrista M. Kroeger, PA-C  12/14/2018 6:03 PM [id number]  Patient seen, examined. Available data reviewed. Agree with findings, assessment, and plan as outlined by Vincent PimpleKrista Kroeger, PA-C. Portions of the history and the entire exam above document my personal findings. The patient likely has experienced vasovagal syncope in the context of multiple antihypertensive agents. His tele demonstrates atrial fibrillation that is rate controlled. While arrhythmia is in the differential, I think his hx is more consistent with hypotension. Recommend stop isosorbide and diltiazem. Otherwise continue a reduced dose of carvedilol and no changes in other medications.  No further inpatient cardiac testing indicated. I would favor liberalizing his BP goal and would accept a systolic  BP of 140-150 mmHg.   Vincent Mendez, M.D. 12/14/2018 10:39 PM

## 2018-12-14 NOTE — ED Notes (Signed)
When orthostatics were being completed pt had no complaints of dizziness. Pt balance was unsteady when standing. Pt needed assist x1.

## 2018-12-15 ENCOUNTER — Telehealth: Payer: Self-pay | Admitting: Physician Assistant

## 2018-12-15 DIAGNOSIS — I951 Orthostatic hypotension: Secondary | ICD-10-CM | POA: Diagnosis not present

## 2018-12-15 DIAGNOSIS — N183 Chronic kidney disease, stage 3 (moderate): Secondary | ICD-10-CM | POA: Diagnosis not present

## 2018-12-15 DIAGNOSIS — Z1159 Encounter for screening for other viral diseases: Secondary | ICD-10-CM | POA: Diagnosis not present

## 2018-12-15 DIAGNOSIS — Z66 Do not resuscitate: Secondary | ICD-10-CM | POA: Diagnosis not present

## 2018-12-15 DIAGNOSIS — R55 Syncope and collapse: Secondary | ICD-10-CM | POA: Diagnosis not present

## 2018-12-15 DIAGNOSIS — Z515 Encounter for palliative care: Secondary | ICD-10-CM

## 2018-12-15 DIAGNOSIS — N401 Enlarged prostate with lower urinary tract symptoms: Secondary | ICD-10-CM | POA: Diagnosis not present

## 2018-12-15 DIAGNOSIS — Z7189 Other specified counseling: Secondary | ICD-10-CM

## 2018-12-15 LAB — GLUCOSE, CAPILLARY: Glucose-Capillary: 110 mg/dL — ABNORMAL HIGH (ref 70–99)

## 2018-12-15 MED ORDER — CARVEDILOL 3.125 MG PO TABS: 3.1250 mg | ORAL_TABLET | Freq: Two times a day (BID) | ORAL | 1 refills | Status: DC

## 2018-12-15 MED ORDER — ISOSORBIDE MONONITRATE ER 30 MG PO TB24: 30.0000 mg | ORAL_TABLET | Freq: Every day | ORAL | 11 refills | Status: DC

## 2018-12-15 MED ORDER — FINASTERIDE 5 MG PO TABS: 2.5000 mg | ORAL_TABLET | Freq: Every day | ORAL | 1 refills | Status: AC

## 2018-12-15 MED ORDER — TAMSULOSIN HCL 0.4 MG PO CAPS: 0.4000 mg | ORAL_CAPSULE | Freq: Every day | ORAL | 1 refills | Status: DC

## 2018-12-15 MED ORDER — ALUM & MAG HYDROXIDE-SIMETH 200-200-20 MG/5ML PO SUSP: 30.0000 mL | Freq: Four times a day (QID) | ORAL | 0 refills | Status: DC | PRN

## 2018-12-15 MED ORDER — HYDROCHLOROTHIAZIDE 12.5 MG PO CAPS: 12.5000 mg | ORAL_CAPSULE | ORAL | 1 refills | Status: DC

## 2018-12-15 NOTE — Discharge Instructions (Signed)

## 2018-12-15 NOTE — Discharge Summary (Signed)
Physician Discharge Summary  Vincent BOYS Sr. XBD:[id number] DOB: [birth year] DOA: 12/14/2018  PCP: Orpah Melter, MD  Admit date: 12/14/2018 Discharge date: 12/15/2018  Time spent: 45  minutes  Recommendations for Outpatient Follow-up:  1. Follow up with PCP 1-2 weeks for evaluation of BP control 2. PT recommend HH PT but family declined   Discharge Diagnoses:  Principal Problem:   Syncope Active Problems:   Dysuria   BPH with obstruction/lower urinary tract symptoms   Paroxysmal atrial fibrillation (HCC)   Coronary artery disease   CKD (chronic kidney disease), stage III ([place])   Hyperlipidemia   Hypertension   Hypothyroidism   Orthostatic hypotension   Anorexia   Discharge Condition: stable  Diet recommendation: heart healthy  Filed Weights   12/14/18 1100 12/15/18 0444  Weight: 68 kg 69.5 kg    History of present illness:  Vincent Kuba Sr. is a 85 y.o. male with medical history significant of atrial fibrillation on Eliquis, benign prostatic hyperplasia, coronary artery disease, dysuria, hyperlipidemia, hypertension, coronary artery disease, and chronic kidney disease stage III presented 7/1 emergency department  with an episode of syncope.  The patient came in via [facility] from home and family stated he was sitting at the table and had a witnessed syncopal event while sitting down.  The patient was lowered to the floor upon the request of 911 and woke up within 5 minutes.  He has had some similar episodes but have not lasted this long.  The event was not a sudden event but actually came on slowly the patient usually sits with his head leaning forward and he was leaning forward for longer than expected and became less responsive and then passed out.  Patient was alert and oriented  but  vague about the events and unable to give a clear history.  He denied any pain or injury to his body.  Had no chest pain no dyspnea.  He had no cough or sputum production.   His biggest concern was that he had a dry mouth at night and did not sleep well in the past few nights because of the dry mouth.  He also stated that he had a poor appetite and this has been present for quite some time.  On discussion with his daughter-in-law she reported he states he has a poor appetite he is able to clean his plate at home.  Patient stated that he had to force himself to eat.  He had not had any recent medication changes.   Hospital Course:   1.  Syncope with associated orthostatic hypotension: Patient is on multiple medications including carvedilol, diltiazem, finasteride, hydrochlorothiazide, Imdur, and tamsulosin all of which can cause use with maintenance of adequate blood pressure. Medications reduced as follows: carvedilol from 6.25-3.125 p.o. twice daily, his Imdur from 60 mg daily to 30 mg daily,Proscar to 2.5 mg at bedtime, stop cardizem per cardiology input. He was provided gentle IV fluids as well. At discharge not orthostatic and SBP 159. Will resume home meds with adjustments as noted above.   2.  Hypertension: As noted above. Please see notes above.  3.  Anorexia: ate all his meals while in hospital. Will stop statin. Cardiology ok with stopping statin.  4.  BPH with lower urinary tract symptoms: encourage fluids up to 7pm. Continue meds 5.  Paroxysmal atrial fibrillation: Patient is currently anticoagulated with Eliquis.  He is on diltiazem 120 mg a day for rate control. Cardiology recommended stopping cardizem and continuing  BB.   6.  Coronary artery disease: Continue current medications which include carvedilol at a lower dose, hydrochlorothiazide, Imdur.  Simvastatin is discontinued due to appetite issues.  7.  Kidney disease stage III: Creatinine is at baseline..  8.  Dysuria: Patient stated that this is a chronic problem and has not changed recently this is noted.  urinalysis unremarkable. Encourage fluids  9.  Hyperlipidemia: stopped statin  10.   Hypothyroidism: We will continue Synthroid.   Procedures:  Consultations:  [facility]  Discharge Exam: Vitals:   12/15/18 0738 12/15/18 1203  BP: (!) 177/105 (!) 159/93  Pulse: 72 72  Resp: 16 18  Temp: 97.8 F (36.6 C) 97.9 F (36.6 C)  SpO2: 98% 94%    General: awake alert smiling chatty Cardiovascular: irregularly irregular no mgr no LE edema Respiratory: normal effort BS clear bilaterally  Discharge Instructions   Discharge Instructions    Call MD for:  difficulty breathing, headache or visual disturbances   Complete by: As directed    Call MD for:  persistant dizziness or light-headedness   Complete by: As directed    Diet - low sodium heart healthy   Complete by: As directed    Discharge instructions   Complete by: As directed    Take medications as prescribed  Follow up with PCP 1-2 weeks for evaluation of BP control   Increase activity slowly   Complete by: As directed      Allergies as of 12/15/2018   No Known Allergies     Medication List    STOP taking these medications   diltiazem 120 MG 24 hr capsule Commonly known as: CARDIZEM CD   simvastatin 80 MG tablet Commonly known as: ZOCOR     TAKE these medications   alum & mag hydroxide-simeth 200-200-20 MG/5ML suspension Commonly known as: MAALOX/MYLANTA Take 30 mLs by mouth every 6 (six) hours as needed for indigestion or heartburn (dyspepsia).   carvedilol 3.125 MG tablet Commonly known as: COREG Take 1 tablet (3.125 mg total) by mouth 2 (two) times daily with a meal. What changed:   medication strength  how much to take   Eliquis 2.5 MG Tabs tablet Generic drug: apixaban Take 1 tablet by mouth twice daily What changed: how much to take   finasteride 5 MG tablet Commonly known as: PROSCAR Take 0.5 tablets (2.5 mg total) by mouth at bedtime. What changed: how much to take   hydrochlorothiazide 12.5 MG capsule Commonly known as: MICROZIDE Take 1 capsule (12.5 mg total)  by mouth every Monday, Wednesday, and Friday. Start taking on: December 16, 2018 What changed: See the new instructions.   isosorbide mononitrate 30 MG 24 hr tablet Commonly known as: IMDUR Take 1 tablet (30 mg total) by mouth daily. What changed:   medication strength  how much to take   levothyroxine 150 MCG tablet Commonly known as: SYNTHROID Take 150 mcg by mouth daily before breakfast.   lubiprostone 8 MCG capsule Commonly known as: AMITIZA Take 8 mcg by mouth 2 (two) times daily with a meal.   tamsulosin 0.4 MG Caps capsule Commonly known as: FLOMAX Take 1 capsule (0.4 mg total) by mouth daily after supper. What changed: when to take this      No Known Allergies Follow-up Information    Koren ShiverMasneri, Shannon M, DO. Go on 12/21/2018.   Specialty: Family Medicine Why: @ 10:30 am  Contact information: [address] [place] [id number] [phone]  The results of significant diagnostics from this hospitalization (including imaging, microbiology, ancillary and laboratory) are listed below for reference.    Significant Diagnostic Studies: Ct Head Wo Contrast  Result Date: 12/14/2018 CLINICAL DATA:  Altered level of consciousness/syncope EXAM: CT HEAD WITHOUT CONTRAST TECHNIQUE: Contiguous axial images were obtained from the base of the skull through the vertex without intravenous contrast. COMPARISON:  September 27, 2015 FINDINGS: Brain: There is mild to moderate diffuse atrophy, stable. There is no intracranial mass 11, hemorrhage, extra-axial fluid collection, or midline shift. There is patchy small vessel disease in the centra semiovale bilaterally. There is no demonstrable acute infarct. Vascular: There is no hyperdense vessel. There is calcification in the left carotid siphon region. Skull: The bony calvarium appears intact. Sinuses/Orbits: There is mucosal thickening in several ethmoid air cells. Other visualized paranasal sinuses are clear. Orbits appear  symmetric bilaterally. Other: Mastoid air cells are clear. IMPRESSION: Atrophy with patchy supratentorial small vessel disease. No acute infarct evident. No mass or hemorrhage. There are foci of arterial vascular calcification. There is mucosal thickening in several ethmoid air cells. Electronically Signed   By: Bretta BangWilliam  Woodruff III M.D.   On: 12/14/2018 12:30   Dg Chest Port 1 View  Result Date: 12/14/2018 CLINICAL DATA:  Syncope. EXAM: PORTABLE CHEST 1 VIEW COMPARISON:  Radiographs of September 27, 2015. FINDINGS: The heart size and mediastinal contours are within normal limits. Atherosclerosis of thoracic aorta is noted. No pneumothorax or pleural effusion is noted. Right lung is clear. Stable minimal left basilar scarring is noted. The visualized skeletal structures are unremarkable. IMPRESSION: Stable probable minimal left basilar scarring. No acute abnormality is noted. Aortic Atherosclerosis (ICD10-I70.0). Electronically Signed   By: Lupita RaiderJames  Green Jr M.D.   On: 12/14/2018 11:36    Microbiology: Recent Results (from the past 240 hour(s))  SARS Coronavirus 2 (CEPHEID - Performed in [facility] hospital lab), Hosp Order     Status: None   Collection Time: 12/14/18  1:04 PM   Specimen: Nasopharyngeal Swab  Result Value Ref Range Status   SARS Coronavirus 2 NEGATIVE NEGATIVE Final    Comment: (NOTE) If result is NEGATIVE SARS-CoV-2 target nucleic acids are NOT DETECTED. The SARS-CoV-2 RNA is generally detectable in upper and lower  respiratory specimens during the acute phase of infection. The lowest  concentration of SARS-CoV-2 viral copies this assay can detect is 250  copies / mL. A negative result does not preclude SARS-CoV-2 infection  and should not be used as the sole basis for treatment or other  patient management decisions.  A negative result may occur with  improper specimen collection / handling, submission of specimen other  than nasopharyngeal swab, presence of viral mutation(s)  within the  areas targeted by this assay, and inadequate number of viral copies  (<250 copies / mL). A negative result must be combined with clinical  observations, patient history, and epidemiological information. If result is POSITIVE SARS-CoV-2 target nucleic acids are DETECTED. The SARS-CoV-2 RNA is generally detectable in upper and lower  respiratory specimens dur ing the acute phase of infection.  Positive  results are indicative of active infection with SARS-CoV-2.  Clinical  correlation with patient history and other diagnostic information is  necessary to determine patient infection status.  Positive results do  not rule out bacterial infection or co-infection with other viruses. If result is PRESUMPTIVE POSTIVE SARS-CoV-2 nucleic acids MAY BE PRESENT.   A presumptive positive result was obtained on the submitted specimen  and confirmed on  repeat testing.  While 2019 novel coronavirus  (SARS-CoV-2) nucleic acids may be present in the submitted sample  additional confirmatory testing may be necessary for epidemiological  and / or clinical management purposes  to differentiate between  SARS-CoV-2 and other Sarbecovirus currently known to infect humans.  If clinically indicated additional testing with an alternate test  methodology [phone] is advised. The SARS-CoV-2 RNA is generally  detectable in upper and lower respiratory sp ecimens during the acute  phase of infection. The expected result is Negative. Fact Sheet for Patients:  BoilerBrush.com.cyhttps://www.fda.gov/media/136312/download Fact Sheet for Healthcare Providers: https://pope.com/https://www.fda.gov/media/136313/download This test is not yet approved or cleared by the [place] FDA and has been authorized for detection and/or diagnosis of SARS-CoV-2 by FDA under an Emergency Use Authorization (EUA).  This EUA will remain in effect (meaning this test can be used) for the duration of the COVID-19 declaration under Section 564(b)(1) of the Act,  21 U.S.C. section 360bbb-3(b)(1), unless the authorization is terminated or revoked sooner. Performed at [facility], [address] [place], [place] [address]      Labs: Basic Metabolic Panel: Recent Labs  Lab 12/14/18 1119  NA 137  K 3.9  CL 103  CO2 23  GLUCOSE 124*  BUN 19  CREATININE 1.67*  CALCIUM 9.4   Liver Function Tests: Recent Labs  Lab 12/14/18 1119  AST 27  ALT 20  ALKPHOS 60  BILITOT 1.2  PROT 7.2  ALBUMIN 4.0   No results for input(s): LIPASE, AMYLASE in the last 168 hours. No results for input(s): AMMONIA in the last 168 hours. CBC: Recent Labs  Lab 12/14/18 1119  WBC 9.5  NEUTROABS 6.8  HGB 15.2  HCT 46.2  MCV 94.7  PLT 192   Cardiac Enzymes: No results for input(s): CKTOTAL, CKMB, CKMBINDEX, TROPONINI in the last 168 hours. BNP: BNP (last 3 results) No results for input(s): BNP in the last 8760 hours.  ProBNP (last 3 results) No results for input(s): PROBNP in the last 8760 hours.  CBG: Recent Labs  Lab 12/14/18 1128 12/15/18 0735  GLUCAP 131* 110*       Signed:  Gwenyth BenderBLACK, M NP  [facility] 12/15/2018, 1:11 PM

## 2018-12-15 NOTE — Telephone Encounter (Signed)
Patient's daughter called with some confusion concerning discharge medications today.   Carvedilol was listed as a current medication. Daughter states this was D/C'ed 01/2017 by Dr. Stanford Breed - which I confirmed in Dr. Jacalyn Lefevre clinic note. It was not listed as a current medication in his 08/10/17 clinic note and the medication has not been filled by pharmacy since 2018.   This medication was listed as a current medication this admission with instructions to reduce to 3.125 mg BID. Since this admission was for syncope, I advised to continue holding this medication for now and discuss if this is needed at his clinic visit with Almyra Deforest 12/30/2018. I did not feel comfortable starting a beta blocker at home immediately after a discharge for syncope.  She expressed understanding of the plan.   Tami Lin Catharine Kettlewell, PA-C 12/15/2018, 6:28 PM

## 2018-12-15 NOTE — Evaluation (Signed)
Physical Therapy Evaluation Patient Details Name: Vincent VALONE Sr. MRN: [id number] DOB: 21-Jun-1922 Today's Date: 12/15/2018   History of Present Illness  Pt is a 85 y/o male admitted secondary to syncopal episode. Pt with positive orthostatics upon admission. CT of head negative for acute abnormality. PMH includes a fib, CAD, CKD, HTN.   Clinical Impression  Pt admitted secondary to problem above with deficits below. Pt requiring min guard A for mobility tasks with and without AD. Pt asymptomatic throughout gait. Pt reports son and daughter in law can assist as needed. Feel pt would benefit from HHPT at d/c. Will continue to follow acutely to maximize functional mobility independence and safety.     Follow Up Recommendations Home health PT    Equipment Recommendations  None recommended by PT    Recommendations for Other Services       Precautions / Restrictions Precautions Precautions: Fall Restrictions Weight Bearing Restrictions: No      Mobility  Bed Mobility Overal bed mobility: Needs Assistance Bed Mobility: Supine to Sit     Supine to sit: Min assist     General bed mobility comments: Min A for trunk elevation. Increased time required to come to EOB.   Transfers Overall transfer level: Needs assistance Equipment used: Rolling walker (2 wheeled) Transfers: Sit to/from Stand Sit to Stand: Min guard         General transfer comment: Min guard for safety. Cues for hand placement.   Ambulation/Gait Ambulation/Gait assistance: Min guard;Supervision Gait Distance (Feet): 150 Feet Assistive device: Rolling walker (2 wheeled);1 person hand held assist Gait Pattern/deviations: Step-through pattern;Decreased stride length Gait velocity: Decreased    General Gait Details: Slow, mildly unsteady gait. Practiced with RW and then with HHA to simulate using cane. Pt requiring min guard A for safety. No overt LOB noted. Pt asymptomatic throughout gait.    Stairs Stairs: (pt declined to practice)          Engineer, building services [place] (Stroke Patients Only)       Balance Overall balance assessment: Needs assistance Sitting-balance support: No upper extremity supported;Feet supported Sitting balance-Leahy Scale: Good     Standing balance support: Single extremity supported;Bilateral upper extremity supported Standing balance-Leahy Scale: Poor Standing balance comment: Reliant on at least 1 UE support                              Pertinent Vitals/Pain Pain Assessment: No/denies pain    Home Living Family/patient expects to be discharged to:: Private residence Living Arrangements: Children Available Help at Discharge: Family;Available 24 hours/day Type of Home: House Home Access: Stairs to enter Entrance Stairs-Rails: None Entrance Stairs-Number of Steps: 1(threshold) Home Layout: Two level Home Equipment: Cane - single point;Walker - 2 wheels      Prior Function Level of Independence: Independent with assistive device(s)         Comments: Was using cane for ambulation      Hand Dominance        Extremity/Trunk Assessment   Upper Extremity Assessment Upper Extremity Assessment: Generalized weakness    Lower Extremity Assessment Lower Extremity Assessment: Generalized weakness    Cervical / Trunk Assessment Cervical / Trunk Assessment: Normal  Communication   Communication: HOH  Cognition Arousal/Alertness: Awake/alert Behavior During Therapy: WFL for tasks assessed/performed Overall Cognitive Status: Within Functional Limits for tasks assessed  General Comments      Exercises     Assessment/Plan    PT Assessment Patient needs continued PT services  PT Problem List Decreased strength;Decreased balance;Decreased mobility;Decreased knowledge of use of DME       PT Treatment Interventions DME instruction;Gait  training;Stair training;Functional mobility training;Therapeutic activities;Therapeutic exercise;Balance training;Patient/family education    PT Goals (Current goals can be found in the Care Plan section)  Acute Rehab PT Goals Patient Stated Goal: to go home PT Goal Formulation: With patient Time For Goal Achievement: 12/29/18 Potential to Achieve Goals: Good    Frequency Min 3X/week   Barriers to discharge        Co-evaluation               AM-PAC PT "6 Clicks" Mobility  Outcome Measure Help needed turning from your back to your side while in a flat bed without using bedrails?: A Little Help needed moving from lying on your back to sitting on the side of a flat bed without using bedrails?: A Little Help needed moving to and from a bed to a chair (including a wheelchair)?: A Little Help needed standing up from a chair using your arms (e.g., wheelchair or bedside chair)?: A Little Help needed to walk in hospital room?: A Little Help needed climbing 3-5 steps with a railing? : A Little 6 Click Score: 18    End of Session Equipment Utilized During Treatment: Gait belt Activity Tolerance: Patient tolerated treatment well Patient left: in bed;with call bell/phone within reach;with bed alarm set(sitting EOB ) Nurse Communication: Mobility status PT Visit Diagnosis: Unsteadiness on feet (R26.81);Muscle weakness (generalized) (M62.81)    Time: [id number] PT Time Calculation (min) (ACUTE ONLY): 20 min   Charges:   PT Evaluation $PT Eval Low Complexity: 1 Low          Gladys DammeBrittany Cleopatra Sardo, PT, DPT  Acute Rehabilitation Services  Pager: [phone] Office: [phone]   Lehman PromBrittany S Tiffaney Heimann 12/15/2018, 12:39 PM

## 2018-12-16 ENCOUNTER — Emergency Department (HOSPITAL_COMMUNITY)
Admission: EM | Admit: 2018-12-16 | Discharge: 2018-12-16 | Disposition: A | Discharge status: Home / Self Care | Payer: Medicare Other | Attending: Emergency Medicine | Admitting: Emergency Medicine

## 2018-12-16 ENCOUNTER — Emergency Department (HOSPITAL_COMMUNITY): Payer: Medicare Other

## 2018-12-16 DIAGNOSIS — J449 Chronic obstructive pulmonary disease, unspecified: Secondary | ICD-10-CM | POA: Insufficient documentation

## 2018-12-16 DIAGNOSIS — I129 Hypertensive chronic kidney disease with stage 1 through stage 4 chronic kidney disease, or unspecified chronic kidney disease: Secondary | ICD-10-CM | POA: Insufficient documentation

## 2018-12-16 DIAGNOSIS — Z79899 Other long term (current) drug therapy: Secondary | ICD-10-CM | POA: Insufficient documentation

## 2018-12-16 DIAGNOSIS — R55 Syncope and collapse: Secondary | ICD-10-CM | POA: Diagnosis present

## 2018-12-16 DIAGNOSIS — R638 Other symptoms and signs concerning food and fluid intake: Secondary | ICD-10-CM | POA: Insufficient documentation

## 2018-12-16 DIAGNOSIS — N183 Chronic kidney disease, stage 3 (moderate): Secondary | ICD-10-CM | POA: Insufficient documentation

## 2018-12-16 DIAGNOSIS — I2510 Atherosclerotic heart disease of native coronary artery without angina pectoris: Secondary | ICD-10-CM | POA: Diagnosis not present

## 2018-12-16 DIAGNOSIS — E860 Dehydration: Secondary | ICD-10-CM | POA: Diagnosis not present

## 2018-12-16 DIAGNOSIS — Z87891 Personal history of nicotine dependence: Secondary | ICD-10-CM | POA: Insufficient documentation

## 2018-12-16 DIAGNOSIS — R42 Dizziness and giddiness: Secondary | ICD-10-CM | POA: Diagnosis not present

## 2018-12-16 DIAGNOSIS — Z7901 Long term (current) use of anticoagulants: Secondary | ICD-10-CM | POA: Diagnosis not present

## 2018-12-16 DIAGNOSIS — E039 Hypothyroidism, unspecified: Secondary | ICD-10-CM | POA: Insufficient documentation

## 2018-12-16 DIAGNOSIS — R531 Weakness: Secondary | ICD-10-CM | POA: Insufficient documentation

## 2018-12-16 LAB — CBC WITH DIFFERENTIAL/PLATELET
Abs Immature Granulocytes: 0.05 10*3/uL (ref 0.00–0.07)
Basophils Absolute: 0 10*3/uL (ref 0.0–0.1)
Basophils Relative: 0 %
Eosinophils Absolute: 0 10*3/uL (ref 0.0–0.5)
Eosinophils Relative: 0 %
HCT: 45.1 % (ref 39.0–52.0)
Hemoglobin: 15 g/dL (ref 13.0–17.0)
Immature Granulocytes: 0 %
Lymphocytes Relative: 20 %
Lymphs Abs: 2.3 10*3/uL (ref 0.7–4.0)
MCH: 31.5 pg (ref 26.0–34.0)
MCHC: 33.3 g/dL (ref 30.0–36.0)
MCV: 94.7 fL (ref 80.0–100.0)
Monocytes Absolute: 1 10*3/uL (ref 0.1–1.0)
Monocytes Relative: 9 %
Neutro Abs: 8.2 10*3/uL — ABNORMAL HIGH (ref 1.7–7.7)
Neutrophils Relative %: 71 %
Platelets: 212 10*3/uL (ref 150–400)
RBC: 4.76 MIL/uL (ref 4.22–5.81)
RDW: 14 % (ref 11.5–15.5)
WBC: 11.6 10*3/uL — ABNORMAL HIGH (ref 4.0–10.5)
nRBC: 0 % (ref 0.0–0.2)

## 2018-12-16 LAB — POTASSIUM: Potassium: 4 mmol/L (ref 3.5–5.1)

## 2018-12-16 LAB — URINALYSIS, ROUTINE W REFLEX MICROSCOPIC
Bilirubin Urine: NEGATIVE
Glucose, UA: NEGATIVE mg/dL
Hgb urine dipstick: NEGATIVE
Ketones, ur: NEGATIVE mg/dL
Leukocytes,Ua: NEGATIVE
Nitrite: NEGATIVE
Protein, ur: NEGATIVE mg/dL
Specific Gravity, Urine: 1.014 (ref 1.005–1.030)
pH: 5 (ref 5.0–8.0)

## 2018-12-16 LAB — BASIC METABOLIC PANEL
Anion gap: 10 (ref 5–15)
BUN: 25 mg/dL — ABNORMAL HIGH (ref 8–23)
CO2: 23 mmol/L (ref 22–32)
Calcium: 9 mg/dL (ref 8.9–10.3)
Chloride: 102 mmol/L (ref 98–111)
Creatinine, Ser: 1.77 mg/dL — ABNORMAL HIGH (ref 0.61–1.24)
GFR calc Af Amer: 37 mL/min — ABNORMAL LOW (ref >60)
GFR calc non Af Amer: 32 mL/min — ABNORMAL LOW (ref >60)
Glucose, Bld: 118 mg/dL — ABNORMAL HIGH (ref 70–99)
Potassium: 4.7 mmol/L (ref 3.5–5.1)
Sodium: 135 mmol/L (ref 135–145)

## 2018-12-16 LAB — CBG MONITORING, ED: Glucose-Capillary: 109 mg/dL — ABNORMAL HIGH (ref 70–99)

## 2018-12-16 MED ORDER — SODIUM CHLORIDE 0.9 % IV BOLUS
500.0000 mL | Freq: Once | INTRAVENOUS | Status: AC
  Administered 2018-12-16: 500 mL via INTRAVENOUS

## 2018-12-16 NOTE — ED Triage Notes (Signed)
Pt arrives [facility] with complaints of dizziness and near syncope. Pt reports he just felt funny today. Denies LOC. Pt comfortable at this time.   Temp 98.2 HR 80 BP 146/92 O2 100%

## 2018-12-16 NOTE — ED Provider Notes (Signed)
Medical screening examination/treatment/procedure(s) were conducted as a shared visit with non-physician practitioner(s) and myself.  I personally evaluated the patient during the encounter.  EKG Interpretation  Date/Time:  Friday December 16 2018 10:54:28 EDT Ventricular Rate:  92 PR Interval:    QRS Duration: 104 QT Interval:  341 QTC Calculation: 413 R Axis:   68 Text Interpretation:  Atrial fibrillation Borderline repolarization abnormality No significant change since last tracing Confirmed by Lacretia Leigh ([id number]) on 12/16/2018 11:31:55 AM  85 year old male who presents here with weakness and near syncope.  Labs are reassuring here.  He can ambulate at his baseline and is stable for discharge   Lacretia Leigh, MD 12/16/18 1458

## 2018-12-16 NOTE — Discharge Instructions (Addendum)
Continue to take your medications as prescribed (keeping in mind medication changes from your recent hospitalization -stop taking carvedilol and Cardizem, Imdur was decreased from 60 mg to 30 mg daily; Proscar to 2.5 mg at bedtime.

## 2018-12-16 NOTE — ED Notes (Signed)
Patient ambulated in room and to restroom.  Patient 02 saturation remained above 97%.

## 2018-12-16 NOTE — ED Provider Notes (Signed)
[facility] Provider Note   CSN: [id number] Arrival date & time: 12/16/18  1048    History   Chief Complaint Chief Complaint  Patient presents with  . Near Syncope  . Dizziness    HPI Vincent CashHassell L Durkee Sr. is a 85 y.o. male with history of arthritis, A. fib, CAD, GERD, CKD, hypertension, hyperlipidemia, hypothyroidism presents for evaluation of ongoing intermittent lightheadedness and near syncope.  He had a recent admission for syncopal episode on 12/14/2018 and discharged yesterday 12/15/2018.  Found to be orthostatic.  His medications were adjusted and his carvedilol was discontinued (per chart review, patient's daughter-in-law called the cardiologist yesterday after discharge from the hospital for recommendations and there is a telephone encounter detailing this); his Imdur was decreased from 60 mg to 30 mg daily; Proscar to 2.5 mg at bedtime, Cardizem was also discontinued.   He tells me that this morning upon awakening and getting dressed he began to feel lightheaded.  Symptoms resolved with rest.  He did not lose consciousness and no head injury was sustained.  He denies any chest pain.  He notes chronic dyspnea on exertion which he states is not worse than baseline.  He does report generalized weakness and decreased oral intake.  Denies headaches, vision changes, abdominal pain, nausea, vomiting, numbness or weakness of the extremities.  He is currently anticoagulated on Eliquis. He is DNR.      The history is provided by the patient.    Past Medical History:  Diagnosis Date  . Arthritis   . Atrial fibrillation (HCC)   . BPH (benign prostatic hyperplasia)   . CAD (coronary artery disease)   . Dysuria   . GERD (gastroesophageal reflux disease)   . HLD (hyperlipidemia)   . HTN (hypertension)   . Hypothyroid   . Syncope 12/14/2018    Patient Active Problem List   Diagnosis Date Noted  . Orthostatic hypotension 12/14/2018  . Anorexia  12/14/2018  . COPD (chronic obstructive pulmonary disease) (HCC) 03/19/2015  . Syncope 03/18/2015  . Paroxysmal atrial fibrillation (HCC) 03/18/2015  . Coronary artery disease 03/18/2015  . Anemia 03/18/2015  . CKD (chronic kidney disease), stage III (HCC) 03/18/2015  . Hyperlipidemia 03/18/2015  . Hypertension 03/18/2015  . Hypothyroidism 03/18/2015  . Chronic constipation 03/18/2015  . B12 deficiency 03/18/2015  . Dysuria 01/13/2015  . BPH with obstruction/lower urinary tract symptoms 01/13/2015    Past Surgical History:  Procedure Laterality Date  . Collapsed Lung     x 2  . THYROID SURGERY    . TRANSURETHRAL RESECTION OF PROSTATE          Home Medications    Prior to Admission medications   Medication Sig Start Date End Date Taking? Authorizing Provider  acetaminophen (TYLENOL) 650 MG CR tablet Take 650 mg by mouth at bedtime.   Yes [provider]  ELIQUIS 2.5 MG TABS tablet Take 1 tablet by mouth twice daily Patient taking differently: Take 2.5 mg by mouth 2 (two) times daily.  11/01/18  Yes Lewayne Buntingrenshaw, Brian S, MD  finasteride (PROSCAR) 5 MG tablet Take 0.5 tablets (2.5 mg total) by mouth at bedtime. 12/15/18  Yes Black, Lesle ChrisKaren M, NP  hydrochlorothiazide (MICROZIDE) 12.5 MG capsule Take 1 capsule (12.5 mg total) by mouth every Monday, Wednesday, and Friday. 12/16/18  Yes Black, Lesle ChrisKaren M, NP  isosorbide mononitrate (IMDUR) 60 MG 24 hr tablet Take 30 mg by mouth daily.   Yes [provider]  levothyroxine (SYNTHROID, LEVOTHROID)  150 MCG tablet Take 150 mcg by mouth daily.  01/07/15  Yes [provider]  polyethylene glycol (MIRALAX / GLYCOLAX) 17 g packet Take 17 g by mouth daily after breakfast.   Yes [provider]  tamsulosin (FLOMAX) 0.4 MG CAPS capsule Take 1 capsule (0.4 mg total) by mouth daily after supper. 12/15/18  Yes Black, Lezlie Octave, NP  alum & mag hydroxide-simeth (MAALOX/MYLANTA) 200-200-20 MG/5ML suspension Take 30 mLs by mouth every  6 (six) hours as needed for indigestion or heartburn (dyspepsia). Patient not taking: Reported on 12/16/2018 12/15/18   Radene Gunning, NP  carvedilol (COREG) 3.125 MG tablet Take 1 tablet (3.125 mg total) by mouth 2 (two) times daily with a meal. Patient not taking: Reported on 12/16/2018 12/15/18   Radene Gunning, NP  isosorbide mononitrate (IMDUR) 30 MG 24 hr tablet Take 1 tablet (30 mg total) by mouth daily. Patient not taking: Reported on 12/16/2018 12/15/18 12/15/19  Radene Gunning, NP    Family History Family History  Problem Relation Age of Onset  . Kidney disease Father   . Bladder Cancer Father     Social History Social History   Tobacco Use  . Smoking status: Former Research scientist (life sciences)  . Smokeless tobacco: Former Systems developer    Types: Chew  Substance Use Topics  . Alcohol use: No    Alcohol/week: 0.0 standard drinks  . Drug use: No     Allergies   Patient has no known allergies.   Review of Systems Review of Systems  Constitutional: Negative for chills and fever.  Respiratory: Negative for shortness of breath.   Cardiovascular: Negative for chest pain.  Gastrointestinal: Negative for abdominal pain, nausea and vomiting.  Neurological: Positive for light-headedness. Negative for syncope, weakness, numbness and headaches.  All other systems reviewed and are negative.    Physical Exam Updated Vital Signs BP (!) 143/96 (BP Location: Right Arm)   Pulse 98   Temp 98 F (36.7 C) (Oral)   Resp 16   SpO2 100%   Physical Exam Vitals signs and nursing note reviewed.  Constitutional:      General: He is not in acute distress.    Appearance: He is well-developed.  HENT:     Head: Normocephalic and atraumatic.  Eyes:     General:        Right eye: No discharge.        Left eye: No discharge.     Extraocular Movements: Extraocular movements intact.     Conjunctiva/sclera: Conjunctivae normal.     Pupils: Pupils are equal, round, and reactive to light.  Neck:     Musculoskeletal: Normal  range of motion and neck supple.     Vascular: No JVD.     Trachea: No tracheal deviation.  Cardiovascular:     Rate and Rhythm: Normal rate. Rhythm irregular.     Pulses: Normal pulses.  Pulmonary:     Effort: Pulmonary effort is normal.     Breath sounds: Normal breath sounds.  Abdominal:     General: Abdomen is flat. Bowel sounds are normal. There is no distension.     Palpations: Abdomen is soft.  Skin:    General: Skin is warm and dry.     Findings: No erythema.  Neurological:     General: No focal deficit present.     Mental Status: He is alert and oriented to person, place, and time.     Sensory: No sensory deficit.     Comments:  Cranial nerves II through XII tested and intact.  Patient oriented to person place and time.  Speech is fluent with no evidence of dysarthria or aphasia.  5/5 strength of BUE and BLE major muscle groups.  Psychiatric:        Behavior: Behavior normal.      ED Treatments / Results  Labs (all labs ordered are listed, but only abnormal results are displayed) Labs Reviewed  CBC WITH DIFFERENTIAL/PLATELET - Abnormal; Notable for the following components:      Result Value   WBC 11.6 (*)    Neutro Abs 8.2 (*)    All other components within normal limits  BASIC METABOLIC PANEL - Abnormal; Notable for the following components:   Glucose, Bld 118 (*)    BUN 25 (*)    Creatinine, Ser 1.77 (*)    GFR calc non Af Amer 32 (*)    GFR calc Af Amer 37 (*)    All other components within normal limits  CBG MONITORING, ED - Abnormal; Notable for the following components:   Glucose-Capillary 109 (*)    All other components within normal limits  URINALYSIS, ROUTINE W REFLEX MICROSCOPIC  POTASSIUM    EKG EKG Interpretation  Date/Time:  Friday December 16 2018 10:54:28 EDT Ventricular Rate:  92 PR Interval:    QRS Duration: 104 QT Interval:  341 QTC Calculation: 413 R Axis:   68 Text Interpretation:  Atrial fibrillation Borderline repolarization  abnormality No significant change since last tracing Confirmed by Lorre NickAllen, Anthony ([id number]) on 12/16/2018 11:36:06 AM   Radiology Dg Chest Portable 1 View  Result Date: 12/16/2018 CLINICAL DATA:  Dizziness. EXAM: PORTABLE CHEST 1 VIEW COMPARISON:  Chest radiograph 12/14/2018 FINDINGS: Monitoring leads overlie the patient. Stable cardiac and mediastinal contours. Aortic atherosclerosis. No consolidative pulmonary opacities. No pleural effusion or pneumothorax. Thoracic spine degenerative changes. IMPRESSION: No acute cardiopulmonary process. Electronically Signed   By: Annia Beltrew  Davis M.D.   On: 12/16/2018 13:10    Procedures Procedures (including critical care time)  Medications Ordered in ED Medications  sodium chloride 0.9 % bolus 500 mL (0 mLs Intravenous Stopped 12/16/18 1654)     Initial Impression / Assessment and Plan / ED Course  I have reviewed the triage vital signs and the nursing notes.  Pertinent labs & imaging results that were available during my care of the patient were reviewed by me and considered in my medical decision making (see chart for details).        Patient returns to the ED today for evaluation of lightheadedness/near syncope.  He is afebrile, vital signs are stable and he is nontoxic in appearance.  He was discharged yesterday from the hospital after evaluation for syncope.  He has a history of syncope.  Found to be on multiple antihypertensive medications and his medications were adjusted with good response in his blood pressure.  Tells me that he began to feel lightheaded as he was getting dressed this morning but had no loss of consciousness.  He is oriented to person place and time.  Normal neurologic examination with no focal deficits noted.  EKG shows atrial fibrillation, which she has a history of and is anticoagulated on Eliquis.  No concern for CVA.  His chest x-ray shows no acute cardiopulmonary abnormalities with no evidence of pneumonia or pleural effusion.   Doubt PE in the absence of hypoxia, shortness of breath, or tachycardia. Labwork today reviewed by me shows mild nonspecific leukocytosis, no anemia, no metabolic derangements, renal insufficiency  at patient's baseline per chart review.  Low suspicion of ACS/MI as he had negative troponins recently and EKG is unchanged, no ischemic abnormalities noted.  Orthostatic vital signs were obtained, he has no orthostatic hypotension but is mildly tachycardic upon standing.  Suspect that he is mildly dehydrated as he exhibits dry oral mucosa and tells me that he has had decreased appetite/decreased oral intake, will give small fluid bolus. He was ambulated in the ED with steady gait and balance prior to fluid bolus administration, denies lightheadedness or dizziness, typically ambulates with the aid of a cane at home. Suspect his near syncope is likely secondary to mild hypovolemia/dehydration and would benefit from continuing with his hypertension medication adjustments.  On reevaluation patient is resting comfortably, reports that he is feeling better.  Discussed strict ED return precautions.  Patient verbalized understanding of and agreement with plan and patient stable for discharge home at this time.  Patient was seen and evaluated by Dr. Freida BusmanAllen who agrees with assessment and plan at this time.  Final Clinical Impressions(s) / ED Diagnoses   Final diagnoses:  Near syncope  Dehydration    ED Discharge Orders    None       Bennye AlmFawze, Shemeika Starzyk A, PA-C 12/17/18 0936    Lorre NickAllen, Anthony, MD 12/18/18 [id number]

## 2018-12-16 NOTE — ED Notes (Signed)
Spoke with pt family. They are not happy he is being discharged and worried he will pass out again. Spoke with [facility] PA and she reassured pt does not meet in patient criteria. This RN explained to family extensively that pt was not orthostatic while in our care. He did not display any syncopal episodes either.   Pt is alert and oriented X4.

## 2018-12-16 NOTE — ED Notes (Signed)
Pt verbalized understanding of discharge instructions and follow up. IV removed and bleeding controllled. Monitored pt BP from sitting to standing. Ptdeni es dizzy. BP 168/69

## 2018-12-19 ENCOUNTER — Other Ambulatory Visit: Payer: Self-pay | Admitting: Physician Assistant

## 2018-12-19 MED ORDER — HYDROCHLOROTHIAZIDE 12.5 MG PO CAPS: 12.5000 mg | ORAL_CAPSULE | ORAL | 1 refills | Status: AC | PRN

## 2018-12-19 NOTE — Telephone Encounter (Signed)
Called the number listed for the patient and the number is not in service.  Called and left a detailed message on the number listed for Vincent Mendez who is on the patient's DPR to give our office a call that I was following up to see how the patient was doing and/or feeling since d/c from hospital.

## 2018-12-19 NOTE — Telephone Encounter (Signed)
I will reassess in office, it does appears patient returned to ED once more. It was suspected he had hypovolemia. Terrah, please check with the patient to see how he is doing, if he continue to be symptomatic after most recent ED discharge, then will need to be seen earlier.

## 2018-12-19 NOTE — Consult Note (Signed)
Consultation Note Date: 12/19/2018   Patient Name: Vincent Mendez  DOB: August 12, 1922  MRN: [id number]  Age / Sex: 85 y.o., male  PCP: Orpah Melter, MD Referring Physician: No att. providers found  Reason for Consultation: Establishing goals of care  HPI/Patient Profile: 85 y.o. male  with past medical history of A. fib on Eliquis, BPH, CAD, dysuria, hyperlipidemia, hypertension, CKD admitted on 12/14/2018 with syncopal episode.  Palliative consulted for goals of care (noted request for completion of most form per admitting provider)  Clinical Assessment and Goals of Care: I met today with Mr. Reesman.   He is a very pleasant 85 year old male who is lying in bed in no distress on entering the room.  In talking with him, he reports that his daughter-in-law, Jacqlyn Larsen, is the person who helps him most with day-to-day care making but his son, Truman Hayward, is his [facility] POA.  He tells me that when it comes to making decisions, he would prefer that I talk with either them rather than trying to have discussion with him.  I attempted to see if he would be able to discuss further regarding goals of care in order to consider completion of most form.  While he is awake, alert, and oriented, it is very difficult to direct him to conversation about advanced care planning or long-term goals of care.  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  He relays that his wife died around a year ago and he has been very lonely since this time.  He is very grateful that his son and daughter-in-law have continued to help him and he lives with them now in [place] after selling his house in [place].  He states the  entire family is thinking about moving to [place] area where his grandson works in Event organiser.  Dr. Lucianne Lei then joined [place] for the end of the encounter and we discussed plan to transition home today.  He was in agreement with me calling his family to discuss further regarding long-term goals of care.  I called and was able to reach his son, Truman Hayward, and daughter-in-law, Jacqlyn Larsen.  We discussed clinical course as well as wishes moving forward in regard to advanced directives.  Concepts specific to code status and rehospitalization discussed.  Values and goals of care important to patient and family were attempted to be elicited.  We reviewed a MOST form and discussed how to develop plan of care to focus on continuing therapies that would maximize chance of being well enough to return home and limiting therapies not in line with this goal.  Family report that this is something they would like to review further and requested that I send home a copy of most form with him as he is discharging today.  Questions and concerns addressed.  PMT will continue to support holistically.  SUMMARY OF RECOMMENDATIONS   -Had initial conversation regarding goals of care and completion of most form with Mr. Kurka and his family.  This is something they would like to review further.  I sent a copy of a most form with him on discharge and recommended that if they would like to complete if they speak further with primary care as an outpatient.  Code Status/Advance Care Planning: DNR      Primary Diagnoses: Present on Admission: . Orthostatic hypotension . Syncope . Dysuria . BPH with obstruction/lower urinary tract symptoms . Paroxysmal atrial fibrillation (HCC) . Coronary artery disease . CKD (chronic kidney disease), stage III ([place]) . Hyperlipidemia . Hypothyroidism . Hypertension . Anorexia   I have reviewed the medical record, interviewed the patient and family, and examined the patient. The  following aspects are pertinent.  Past Medical History:  Diagnosis Date  . Arthritis   . Atrial fibrillation ([place])   . BPH (benign prostatic hyperplasia)   . CAD (coronary artery disease)   . Dysuria   . GERD (gastroesophageal reflux disease)   . HLD (hyperlipidemia)   . HTN (hypertension)   . Hypothyroid   . Syncope 12/14/2018   Social History   Socioeconomic History  . Marital status: Married    Spouse name: Not on file  . Number of children: Not on file  . Years of education: Not on file  . Highest education level: Not on file  Occupational History  . Not on file  Social Needs  . Financial resource strain: Not on file  . Food insecurity    Worry: Not on file    Inability: Not on file  . Transportation needs    Medical: Not on file    Non-medical: Not on file  Tobacco Use  . Smoking status: Former Research scientist (life sciences)  . Smokeless tobacco: Former Systems developer    Types: Chew  Substance and Sexual Activity  . Alcohol use: No    Alcohol/week: 0.0 standard drinks  . Drug use: No  . Sexual activity: Not on file  Lifestyle  . Physical activity    Days per week: Not on file    Minutes per session: Not on file  . Stress: Not on file  Relationships  . Social Herbalist on phone: Not on file    Gets together: Not on file    Attends religious service: Not on file    Active member of club or organization: Not on file    Attends meetings of clubs or organizations: Not on file    Relationship status: Not on file  Other Topics Concern  . Not on file  Social History Narrative  . Not on file   Family History  Problem Relation Age of Onset  . Kidney disease Father   . Bladder Cancer Father    Scheduled Meds: Continuous Infusions: PRN Meds:. Medications Prior to Admission:  Prior to Admission medications   Medication Sig Start Date End Date Taking? Authorizing Provider  ELIQUIS 2.5 MG TABS tablet Take 1 tablet by mouth twice daily Patient taking differently: Take 2.5 mg by  mouth 2 (two) times daily.  11/01/18  Yes Lelon Perla, MD  levothyroxine (SYNTHROID, LEVOTHROID) 150 MCG tablet Take 150 mcg by mouth daily.  01/07/15  Yes [provider]  acetaminophen (TYLENOL) 650 MG CR tablet Take 650 mg by mouth at bedtime.    [provider]  alum & mag hydroxide-simeth (  MAALOX/MYLANTA) 200-200-20 MG/5ML suspension Take 30 mLs by mouth every 6 (six) hours as needed for indigestion or heartburn (dyspepsia). Patient not taking: Reported on 12/16/2018 12/15/18   Radene Gunning, NP  carvedilol (COREG) 3.125 MG tablet Take 1 tablet (3.125 mg total) by mouth 2 (two) times daily with a meal. Patient not taking: Reported on 12/16/2018 12/15/18   Radene Gunning, NP  finasteride (PROSCAR) 5 MG tablet Take 0.5 tablets (2.5 mg total) by mouth at bedtime. 12/15/18   Radene Gunning, NP  hydrochlorothiazide (MICROZIDE) 12.5 MG capsule Take 1 capsule (12.5 mg total) by mouth every Monday, Wednesday, and Friday. 12/16/18   Black, Lezlie Octave, NP  isosorbide mononitrate (IMDUR) 30 MG 24 hr tablet Take 1 tablet (30 mg total) by mouth daily. Patient not taking: Reported on 12/16/2018 12/15/18 12/15/19  Radene Gunning, NP  isosorbide mononitrate (IMDUR) 60 MG 24 hr tablet Take 30 mg by mouth daily.    [provider]  polyethylene glycol (MIRALAX / GLYCOLAX) 17 g packet Take 17 g by mouth daily after breakfast.    [provider]  tamsulosin (FLOMAX) 0.4 MG CAPS capsule Take 1 capsule (0.4 mg total) by mouth daily after supper. 12/15/18   Black, Lezlie Octave, NP   No Known Allergies Review of Systems  Neurological: Positive for dizziness and weakness.   Physical Exam General: Alert, awake, in no acute distress.  HEENT: No bruits, no goiter, no JVD Heart: Regular rate and rhythm. No murmur appreciated. Lungs: Good air movement, clear Abdomen: Soft, nontender, nondistended, positive bowel sounds.  Ext: No significant edema Skin: Warm and dry Neuro: Grossly intact,  nonfocal.   Vital Signs: BP (!) 146/80 (BP Location: Right Arm)   Pulse 78   Temp 97.8 F (36.6 C) (Oral)   Resp 17   Ht 5' 8" (1.727 m)   Wt 69.5 kg   SpO2 97%   BMI 23.31 kg/m  Pain Scale: 0-10   Pain Score: 0-No pain   SpO2: SpO2: 97 % O2 Device:SpO2: 97 % O2 Flow Rate: .   IO: Intake/output summary: No intake or output data in the 24 hours ending 12/19/18 0950  LBM: Last BM Date: 12/14/18 Baseline Weight: Weight: 68 kg Most recent weight: Weight: 69.5 kg     Palliative Assessment/Data:   Flowsheet Rows     Most Recent Value  Intake Tab  Referral Department  Hospitalist  Unit at Time of Referral  ER  Palliative Care Primary Diagnosis  Cardiac  Date Notified  12/14/18  Palliative Care Type  New Palliative care  Reason for referral  Clarify Goals of Care  Date of Admission  12/14/18  Date first seen by Palliative Care  12/15/18  # of days Palliative referral response time  1 Day(s)  # of days IP prior to Palliative referral  0  Clinical Assessment  Palliative Performance Scale Score  60%  Psychosocial & Spiritual Assessment  Palliative Care Outcomes  Patient/Family meeting held?  Yes  Who was at the meeting?  Patient, son and daughter in law via phone      Time In: 1120  Time Out: 1240 Time Total: 80 Greater than 50%  of this time was spent counseling and coordinating care related to the above assessment and plan.  Signed by: Micheline Rough, MD   Please contact Palliative Medicine Team phone at [phone] for questions and concerns.  For individual provider: See Shea Evans

## 2018-12-19 NOTE — Telephone Encounter (Signed)
I have called and spoke to [facility], his daughter in law. Medication list verified. Note, he has 60mg  tablets of Imdur which he cut in half and take 30mg  daily. He will began to pick up 30mg  tablet during next med refill, therefore I left both Rx in his med list. Otherwise, given his advanced age and risk for dehydration, advised to switch HCTZ MWF to PRN only for swelling. Otherwise, the only BP med he is currently on is 30mg  daily of Imdur. I also checked with his daughter in law, despite not on any rate control medication (as coreg stopped in 2018 and cardizen was discontinued during recent admission), his HR is mostly in the 80-90s. Seems to be self-rate controlled. OK with stopping coreg for now as patient has not been on coreg since at least 2018.

## 2018-12-29 ENCOUNTER — Telehealth: Payer: Self-pay | Admitting: Physician Assistant

## 2018-12-29 NOTE — Telephone Encounter (Signed)
New Message ° ° ° °Left message to confirm appt and answer covid questions  °

## 2018-12-29 NOTE — Telephone Encounter (Signed)
Follow up         COVID-19 Pre-Screening Questions:   In the past 7 to 10 days have you had a cough,  shortness of breath, headache, congestion, fever (100 or greater) body aches, chills, sore throat, or sudden loss of taste or sense of smell? NO  Have you been around anyone with known Covid 19. NO  Have you been around anyone who is awaiting Covid 19 test results in the past 7 to 10 days? NO  Have you been around anyone who has been exposed to Covid 19, or has mentioned symptoms of Covid 19 within the past 7 to 10 days? NO Pts dautghter in law will be assisting pt to his appt, She answered NO to all screening questions   If you have any concerns/questions about symptoms patients report during screening (either on the phone or at threshold). Contact the provider seeing the patient or DOD for further guidance.  If neither are available contact a member of the leadership team.

## 2018-12-30 ENCOUNTER — Other Ambulatory Visit: Payer: Self-pay

## 2018-12-30 ENCOUNTER — Encounter: Payer: Self-pay | Admitting: Physician Assistant

## 2018-12-30 ENCOUNTER — Ambulatory Visit (INDEPENDENT_AMBULATORY_CARE_PROVIDER_SITE_OTHER): Payer: Medicare Other | Admitting: Physician Assistant

## 2018-12-30 VITALS — BP 142/92 | HR 77 | Ht 68.0 in | Wt 157.2 lb

## 2018-12-30 DIAGNOSIS — I2510 Atherosclerotic heart disease of native coronary artery without angina pectoris: Secondary | ICD-10-CM | POA: Diagnosis not present

## 2018-12-30 DIAGNOSIS — E039 Hypothyroidism, unspecified: Secondary | ICD-10-CM

## 2018-12-30 DIAGNOSIS — I482 Chronic atrial fibrillation, unspecified: Secondary | ICD-10-CM | POA: Diagnosis not present

## 2018-12-30 DIAGNOSIS — I10 Essential (primary) hypertension: Secondary | ICD-10-CM | POA: Diagnosis not present

## 2018-12-30 DIAGNOSIS — R55 Syncope and collapse: Secondary | ICD-10-CM | POA: Diagnosis not present

## 2018-12-30 DIAGNOSIS — E7800 Pure hypercholesterolemia, unspecified: Secondary | ICD-10-CM

## 2018-12-30 NOTE — Patient Instructions (Signed)
Medication Instructions:   Continue Isosorbide Mononitrate (Imdur) 30 mg daily Your physician recommends that you continue on your current medications as directed. Please refer to the Current Medication list given to you today.  If you need a refill on your cardiac medications before your next appointment, please call your pharmacy.   Lab work: NONE ordered at this time of appointment   If you have labs (blood work) drawn today and your tests are completely normal, you will receive your results only by: Marland Kitchen MyChart Message (if you have MyChart) OR . A paper copy in the mail If you have any lab test that is abnormal or we need to change your treatment, we will call you to review the results.  Testing/Procedures: NONE ordered at this time of appointment   Follow-Up: At [facility], you and your health needs are our priority.  As part of our continuing mission to provide you with exceptional heart care, we have created designated Provider Care Teams.  These Care Teams include your primary Cardiologist (physician) and Advanced Practice Providers (APPs -  Physician Assistants and Nurse Practitioners) who all work together to provide you with the care you need, when you need it. You will need a follow up appointment in 3 months.  Please call our office 2 months in advance to schedule this appointment.  You may see Kirk Ruths, MD or one of the following Advanced Practice Providers on your designated Care Team:   Kerin Ransom, PA-C Roby Lofts, [place] . Sande Rives, PA-C  Any Other Special Instructions Will Be Listed Below (If Applicable).

## 2018-12-30 NOTE — Progress Notes (Signed)
Cardiology Office Note    Date:  01/01/2019   ID:  Vincent CashHassell L Mazurowski Sr., DOB [birth year], MRN [id number]  PCP:  Joycelyn RuaMeyers, Stephen, MD  Cardiologist:  Dr. Jens Somrenshaw   Chief Complaint  Patient presents with   Follow-up    seen for Dr. Jens Somrenshaw.     History of Present Illness:  Vincent CashHassell L Zeringue Sr. is a 85 y.o. male with PMH of CAD, PAF, BPH, hypertension, hyperlipidemia, hypothyroidism and syncope.  Patient had a cardiac catheterization at [facility] 20 years ago.  He presented with atrial fibrillation and syncope in October 2016.  The syncope was felt to be related to micturition.  He converted to sinus rhythm spontaneously.  EF was 60 to 65% of the time.  In November 2016, he was back in A. fib and had lower extremity edema.  His Procardia was changed to diltiazem and a 3 times weekly dose of the hydrochlorothiazide was added.  He has been on Eliquis for anticoagulation purposes.  His last office visit with Dr. Jens Somrenshaw was in February 2020 at which time he was doing well.  More recently, patient was admitted on 12/14/2018 with syncope.  He was sitting at a table when he slowly leaned forward and subsequently lost consciousness.  Total time down was 5 minutes.  He reportedly woke up after being lowered to the ground.  It was felt he likely experienced vasovagal syncope in the setting of multiple high antihypertensive agents.  He was orthostatic at the time.  Telemetry demonstrated atrial fibrillation which was rate controlled.  His isosorbide was decreased and diltiazem was stopped.  Although carvedilol was on his medication list, this was actually discontinued it back in 2018 by Dr. Jens Somrenshaw and the patient has not been taking it.  His systolic blood pressure goal was set to 140-150 mmHg.  He returned on 12/16/2018 with recurrent presyncope.  He had no further orthostatic hypotension however was mildly tachycardic upon standing.  It was felt that he likely was dehydrated.  I did advise him to  switch his hydrochlorothiazide Monday Wednesday Friday to as needed only for swelling.  The only blood pressure medication he is currently taking is 30 mg daily of isosorbide mononitrate.  Patient presents today to cardiology office visit along with [name], his daughter.  He denies any recent chest pain or shortness of breath.  His daughter says he has very limited functional ability and does not do much at home.  His heart rate is very well controlled despite remaining atrial fibrillation.  He is drinking a lot more water now and has not noticed any recurrence of syncope or dizziness.  At this time I did not recommend any heart monitor unless his symptom recurs.  Looking at his blood pressure diary, looks like in early July his blood pressure was ranging in the 80s to low 110s.  Since then his blood pressure has came back back up in the 120s to 150s.    Past Medical History:  Diagnosis Date   Arthritis    Atrial fibrillation (HCC)    BPH (benign prostatic hyperplasia)    CAD (coronary artery disease)    Dysuria    GERD (gastroesophageal reflux disease)    HLD (hyperlipidemia)    HTN (hypertension)    Hypothyroid    Syncope 12/14/2018    Past Surgical History:  Procedure Laterality Date   Collapsed Lung     x 2   THYROID SURGERY     TRANSURETHRAL RESECTION OF PROSTATE  Current Medications: Outpatient Medications Prior to Visit  Medication Sig Dispense Refill   acetaminophen (TYLENOL) 650 MG CR tablet Take 650 mg by mouth at bedtime.     ELIQUIS 2.5 MG TABS tablet Take 1 tablet by mouth twice daily (Patient taking differently: Take 2.5 mg by mouth 2 (two) times daily. ) 180 tablet 1   finasteride (PROSCAR) 5 MG tablet Take 0.5 tablets (2.5 mg total) by mouth at bedtime. 30 tablet 1   hydrochlorothiazide (MICROZIDE) 12.5 MG capsule Take 1 capsule (12.5 mg total) by mouth as needed. 30 capsule 1   isosorbide mononitrate (IMDUR) 30 MG 24 hr tablet Take 1 tablet (30  mg total) by mouth daily. 30 tablet 11   levothyroxine (SYNTHROID, LEVOTHROID) 150 MCG tablet Take 150 mcg by mouth daily.      polyethylene glycol (MIRALAX / GLYCOLAX) 17 g packet Take 17 g by mouth daily after breakfast.     tamsulosin (FLOMAX) 0.4 MG CAPS capsule Take 1 capsule (0.4 mg total) by mouth daily after supper. 30 capsule 1   isosorbide mononitrate (IMDUR) 60 MG 24 hr tablet Take 30 mg by mouth daily.     No facility-administered medications prior to visit.      Allergies:   Patient has no known allergies.   Social History   Socioeconomic History   Marital status: Married    Spouse name: Not on file   Number of children: Not on file   Years of education: Not on file   Highest education level: Not on file  Occupational History   Not on file  Social Needs   Financial resource strain: Not on file   Food insecurity    Worry: Not on file    Inability: Not on file   Transportation needs    Medical: Not on file    Non-medical: Not on file  Tobacco Use   Smoking status: Former Smoker   Smokeless tobacco: Former Systems developer    Types: Chew  Substance and Sexual Activity   Alcohol use: No    Alcohol/week: 0.0 standard drinks   Drug use: No   Sexual activity: Not on file  Lifestyle   Physical activity    Days per week: Not on file    Minutes per session: Not on file   Stress: Not on file  Relationships   Social connections    Talks on phone: Not on file    Gets together: Not on file    Attends religious service: Not on file    Active member of club or organization: Not on file    Attends meetings of clubs or organizations: Not on file    Relationship status: Not on file  Other Topics Concern   Not on file  Social History Narrative   Not on file     Family History:  The patient's family history includes Bladder Cancer in his father; Kidney disease in his father.   ROS:   Please see the history of present illness.    ROS All other systems  reviewed and are negative.   PHYSICAL EXAM:   VS:  BP (!) 142/92    Pulse 77    Ht 5\' 8"  (1.727 m)    Wt 157 lb 3.2 oz (71.3 kg)    SpO2 97%    BMI 23.90 kg/m    GEN: Well nourished, well developed, in no acute distress  HEENT: normal  Neck: no JVD, carotid bruits, or masses Cardiac: RRR; no murmurs, rubs,  or gallops,no edema  Respiratory:  clear to auscultation bilaterally, normal work of breathing GI: soft, nontender, nondistended, + BS MS: no deformity or atrophy  Skin: warm and dry, no rash Neuro:  Alert and Oriented x 3, Strength and sensation are intact Psych: euthymic mood, full affect  Wt Readings from Last 3 Encounters:  12/30/18 157 lb 3.2 oz (71.3 kg)  12/15/18 153 lb 4.8 oz (69.5 kg)  08/11/18 159 lb (72.1 kg)      Studies/Labs Reviewed:   EKG:  EKG is not ordered today.    Recent Labs: 12/14/2018: ALT 20 12/16/2018: BUN 25; Creatinine, Ser 1.77; Hemoglobin 15.0; Platelets 212; Potassium 4.0; Sodium 135   Lipid Panel    Component Value Date/Time   CHOL 134 08/11/2018 0908   TRIG 102 08/11/2018 0908   HDL 54 08/11/2018 0908   CHOLHDL 2.5 08/11/2018 0908   CHOLHDL 2.7 03/18/2015 1230   VLDL 7 03/18/2015 1230   LDLCALC 60 08/11/2018 0908    Additional studies/ records that were reviewed today include:   Echo 03/18/2015 LV EF: 60% -   65% Study Conclusions  - Left ventricle: The cavity size was normal. Wall thickness was   normal. Systolic function was normal. The estimated ejection   fraction was in the range of 60% to 65%. Wall motion was normal;   there were no regional wall motion abnormalities.     ASSESSMENT:    1. Syncope and collapse   2. Coronary artery disease involving native coronary artery of native heart without angina pectoris   3. Chronic atrial fibrillation   4. Essential hypertension   5. Pure hypercholesterolemia   6. Hypothyroidism, unspecified type      PLAN:  In order of problems listed above:  1. Syncope: He was  recently seen in the hospital by cardiology service.  The diltiazem was stopped and isosorbide was decreased.  Since discharge, he had 1 more episode of dizzy spell, I recommended he stop the hydrochlorothiazide and change it to as needed only.  I suspect he was a little dry.  Since stopping the hydrochlorothiazide he has not had any more episodes.  2. CAD: Denies any recent chest pain  3. Chronic atrial fibrillation: Continue Eliquis.  Self rate controlled without any AV nodal blocking agent  4. Hypertension: Blood pressure goal 140-150s according to Dr. Excell Seltzerooper.  5. Hyperlipidemia: Diet and exercise.  The last lipid panel in February 2020 showed very well-controlled total cholesterol, LDL and HDL  6. Hypothyroidism: Managed by primary care provider.    Medication Adjustments/Labs and Tests Ordered: Current medicines are reviewed at length with the patient today.  Concerns regarding medicines are outlined above.  Medication changes, Labs and Tests ordered today are listed in the Patient Instructions below. Patient Instructions  Medication Instructions:   Continue Isosorbide Mononitrate (Imdur) 30 mg daily Your physician recommends that you continue on your current medications as directed. Please refer to the Current Medication list given to you today.  If you need a refill on your cardiac medications before your next appointment, please call your pharmacy.   Lab work: NONE ordered at this time of appointment   If you have labs (blood work) drawn today and your tests are completely normal, you will receive your results only by:  MyChart Message (if you have MyChart) OR  A paper copy in the mail If you have any lab test that is abnormal or we need to change your treatment, we will call you to review  the results.  Testing/Procedures: NONE ordered at this time of appointment   Follow-Up: At [facility], you and your health needs are our priority.  As part of our continuing  mission to provide you with exceptional heart care, we have created designated Provider Care Teams.  These Care Teams include your primary Cardiologist (physician) and Advanced Practice Providers (APPs -  Physician Assistants and Nurse Practitioners) who all work together to provide you with the care you need, when you need it. You will need a follow up appointment in 3 months.  Please call our office 2 months in advance to schedule this appointment.  You may see Olga MillersBrian Crenshaw, MD or one of the following Advanced Practice Providers on your designated Care Team:   Corine ShelterLuke Kilroy, PA-C Judy PimpleKrista Kroeger, PA-C  Marjie Skiffallie Goodrich, [place]PA-C  Any Other Special Instructions Will Be Listed Below (If Applicable).       Ramond DialSigned, Aanchal Cope, [place]PA  01/01/2019 10:10 PM    [facility] [address], [place], [place]  [address] Phone: [phone]; Fax: [phone]

## 2019-01-01 ENCOUNTER — Encounter: Payer: Self-pay | Admitting: Physician Assistant

## 2019-01-05 ENCOUNTER — Emergency Department (HOSPITAL_BASED_OUTPATIENT_CLINIC_OR_DEPARTMENT_OTHER): Payer: Medicare Other

## 2019-01-05 ENCOUNTER — Other Ambulatory Visit: Payer: Self-pay

## 2019-01-05 ENCOUNTER — Emergency Department (HOSPITAL_BASED_OUTPATIENT_CLINIC_OR_DEPARTMENT_OTHER)
Admission: EM | Admit: 2019-01-05 | Discharge: 2019-01-05 | Disposition: A | Discharge status: Home / Self Care | Payer: Medicare Other | Attending: Emergency Medicine | Admitting: Emergency Medicine

## 2019-01-05 ENCOUNTER — Encounter (HOSPITAL_BASED_OUTPATIENT_CLINIC_OR_DEPARTMENT_OTHER): Payer: Self-pay | Admitting: *Deleted

## 2019-01-05 DIAGNOSIS — S40811A Abrasion of right upper arm, initial encounter: Secondary | ICD-10-CM | POA: Diagnosis not present

## 2019-01-05 DIAGNOSIS — S0990XA Unspecified injury of head, initial encounter: Secondary | ICD-10-CM | POA: Diagnosis not present

## 2019-01-05 DIAGNOSIS — M25531 Pain in right wrist: Secondary | ICD-10-CM | POA: Diagnosis not present

## 2019-01-05 DIAGNOSIS — J449 Chronic obstructive pulmonary disease, unspecified: Secondary | ICD-10-CM | POA: Insufficient documentation

## 2019-01-05 DIAGNOSIS — N183 Chronic kidney disease, stage 3 (moderate): Secondary | ICD-10-CM | POA: Insufficient documentation

## 2019-01-05 DIAGNOSIS — E039 Hypothyroidism, unspecified: Secondary | ICD-10-CM | POA: Insufficient documentation

## 2019-01-05 DIAGNOSIS — W010XXA Fall on same level from slipping, tripping and stumbling without subsequent striking against object, initial encounter: Secondary | ICD-10-CM | POA: Insufficient documentation

## 2019-01-05 DIAGNOSIS — Y999 Unspecified external cause status: Secondary | ICD-10-CM | POA: Insufficient documentation

## 2019-01-05 DIAGNOSIS — I129 Hypertensive chronic kidney disease with stage 1 through stage 4 chronic kidney disease, or unspecified chronic kidney disease: Secondary | ICD-10-CM | POA: Diagnosis not present

## 2019-01-05 DIAGNOSIS — Y929 Unspecified place or not applicable: Secondary | ICD-10-CM | POA: Insufficient documentation

## 2019-01-05 DIAGNOSIS — M25561 Pain in right knee: Secondary | ICD-10-CM | POA: Insufficient documentation

## 2019-01-05 DIAGNOSIS — I2510 Atherosclerotic heart disease of native coronary artery without angina pectoris: Secondary | ICD-10-CM | POA: Diagnosis not present

## 2019-01-05 DIAGNOSIS — M25511 Pain in right shoulder: Secondary | ICD-10-CM | POA: Insufficient documentation

## 2019-01-05 DIAGNOSIS — Y9301 Activity, walking, marching and hiking: Secondary | ICD-10-CM | POA: Diagnosis not present

## 2019-01-05 DIAGNOSIS — W19XXXA Unspecified fall, initial encounter: Secondary | ICD-10-CM

## 2019-01-05 DIAGNOSIS — Z87891 Personal history of nicotine dependence: Secondary | ICD-10-CM | POA: Insufficient documentation

## 2019-01-05 DIAGNOSIS — S0081XA Abrasion of other part of head, initial encounter: Secondary | ICD-10-CM | POA: Insufficient documentation

## 2019-01-05 MED ORDER — IOHEXOL 300 MG/ML  SOLN: 100.0000 mL | Freq: Once | INTRAMUSCULAR | Status: DC | PRN

## 2019-01-05 NOTE — ED Notes (Signed)
Patient transported to CT 

## 2019-01-05 NOTE — ED Notes (Signed)
Wounds irrigated with NS and dressings applied. Pt tolerated well.
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
## 2019-01-05 NOTE — Discharge Instructions (Signed)
You were seen in the emergency department today after a fall.  Keep your wounds clean and dry.  You can cover them with gauze or bandage.  Please return to the emergency department with any sudden worsening headache, chest pain, or other severe symptoms.
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
## 2019-04-03 ENCOUNTER — Other Ambulatory Visit: Payer: Self-pay | Admitting: Cardiology

## 2019-04-11 ENCOUNTER — Ambulatory Visit: Payer: Medicare Other | Admitting: Cardiology

## 2019-04-21 ENCOUNTER — Encounter: Payer: Self-pay | Admitting: Cardiology

## 2019-04-21 ENCOUNTER — Ambulatory Visit (INDEPENDENT_AMBULATORY_CARE_PROVIDER_SITE_OTHER): Payer: Medicare Other | Admitting: Cardiology

## 2019-04-21 ENCOUNTER — Other Ambulatory Visit: Payer: Self-pay

## 2019-04-21 DIAGNOSIS — I10 Essential (primary) hypertension: Secondary | ICD-10-CM | POA: Diagnosis not present

## 2019-04-21 DIAGNOSIS — I482 Chronic atrial fibrillation, unspecified: Secondary | ICD-10-CM

## 2019-04-21 DIAGNOSIS — Z87898 Personal history of other specified conditions: Secondary | ICD-10-CM | POA: Diagnosis not present

## 2019-04-21 DIAGNOSIS — I2510 Atherosclerotic heart disease of native coronary artery without angina pectoris: Secondary | ICD-10-CM

## 2019-04-21 DIAGNOSIS — Z7901 Long term (current) use of anticoagulants: Secondary | ICD-10-CM

## 2019-04-21 DIAGNOSIS — N1832 Chronic kidney disease, stage 3b: Secondary | ICD-10-CM

## 2019-04-21 NOTE — Assessment & Plan Note (Signed)
B/P goal [id number] systolic

## 2019-04-21 NOTE — Progress Notes (Signed)
Cardiology Office Note:    Date:  04/21/2019   ID:  Vincent CashHassell L Treadway Sr., DOB November 16, 1922, MRN [id number]  PCP:  Joycelyn RuaMeyers, Stephen, MD  Cardiologist:  Olga MillersBrian Crenshaw, MD  Electrophysiologist:  None   Referring MD: Joycelyn RuaMeyers, Stephen, MD   No chief complaint on file.   History of Present Illness:    Vincent CashHassell L Gordner Sr. is a 85 y.o. male with a hx of remote CAD [facility].  He has CAF and is on low dose Eliquis. He had an episode of syncope this past summer and his Diltiazem was stopped then later his diuretic was decreased. His B/P goal is 140-150 systolic. He was last in the office July 2020.  He comes in today with his son. He has been doing well since we last saw him. He doesn't do much at home-basically bed to BR.  No further syncope, no chest pain.   Past Medical History:  Diagnosis Date  . Arthritis   . Atrial fibrillation ([facility])   . BPH (benign prostatic hyperplasia)   . CAD (coronary artery disease)   . Dysuria   . GERD (gastroesophageal reflux disease)   . HLD (hyperlipidemia)   . HTN (hypertension)   . Hypothyroid   . Syncope 12/14/2018    Past Surgical History:  Procedure Laterality Date  . Collapsed Lung     x 2  . THYROID SURGERY    . TRANSURETHRAL RESECTION OF PROSTATE      Current Medications: Current Meds  Medication Sig  . acetaminophen (TYLENOL) 650 MG CR tablet Take 650 mg by mouth at bedtime.  Marland Kitchen. ELIQUIS 2.5 MG TABS tablet Take 1 tablet by mouth twice daily (Patient taking differently: Take 2.5 mg by mouth 2 (two) times daily. )  . finasteride (PROSCAR) 5 MG tablet Take 0.5 tablets (2.5 mg total) by mouth at bedtime.  . hydrochlorothiazide (MICROZIDE) 12.5 MG capsule Take 1 capsule (12.5 mg total) by mouth as needed.  . isosorbide mononitrate (IMDUR) 30 MG 24 hr tablet Take 1 tablet (30 mg total) by mouth daily.  Marland Kitchen. levothyroxine (SYNTHROID, LEVOTHROID) 150 MCG tablet Take 150 mcg by mouth daily.   . polyethylene glycol (MIRALAX / GLYCOLAX) 17 g  packet Take 17 g by mouth daily after breakfast.  . tamsulosin (FLOMAX) 0.4 MG CAPS capsule Take 1 capsule (0.4 mg total) by mouth daily after supper.     Allergies:   Patient has no known allergies.   Social History   Socioeconomic History  . Marital status: Married    Spouse name: Not on file  . Number of children: Not on file  . Years of education: Not on file  . Highest education level: Not on file  Occupational History  . Not on file  Social Needs  . Financial resource strain: Not on file  . Food insecurity    Worry: Not on file    Inability: Not on file  . Transportation needs    Medical: Not on file    Non-medical: Not on file  Tobacco Use  . Smoking status: Former Games developermoker  . Smokeless tobacco: Former NeurosurgeonUser    Types: Chew  Substance and Sexual Activity  . Alcohol use: No    Alcohol/week: 0.0 standard drinks  . Drug use: No  . Sexual activity: Not on file  Lifestyle  . Physical activity    Days per week: Not on file    Minutes per session: Not on file  . Stress: Not on  file  Relationships  . Social Herbalist on phone: Not on file    Gets together: Not on file    Attends religious service: Not on file    Active member of club or organization: Not on file    Attends meetings of clubs or organizations: Not on file    Relationship status: Not on file  Other Topics Concern  . Not on file  Social History Narrative  . Not on file     Family History: The patient's family history includes Bladder Cancer in his father; Kidney disease in his father.  ROS:   Please see the history of present illness.     All other systems reviewed and are negative.  EKGs/Labs/Other Studies Reviewed:     Recent Labs: 12/14/2018: ALT 20 12/16/2018: BUN 25; Creatinine, Ser 1.77; Hemoglobin 15.0; Platelets 212; Potassium 4.0; Sodium 135  Recent Lipid Panel    Component Value Date/Time   CHOL 134 08/11/2018 0908   TRIG 102 08/11/2018 0908   HDL 54 08/11/2018 0908    CHOLHDL 2.5 08/11/2018 0908   CHOLHDL 2.7 03/18/2015 1230   VLDL 7 03/18/2015 1230   LDLCALC 60 08/11/2018 0908    Physical Exam:    VS:  BP (!) 152/84   Pulse 84   Temp 98.6 F (37 C) (Temporal)   Ht 5\' 8"  (1.727 m)   Wt 161 lb (73 kg)   BMI 24.48 kg/m     Wt Readings from Last 3 Encounters:  04/21/19 161 lb (73 kg)  01/05/19 155 lb (70.3 kg)  12/30/18 157 lb 3.2 oz (71.3 kg)     GEN:  Elderly male, well developed in no acute distress HEENT: Normal NECK: No JVD; No carotid bruits LYMPHATICS: No lymphadenopathy CARDIAC: irregularly irregular, no murmurs, rubs, gallops RESPIRATORY:  Clear to auscultation without rales, wheezing or rhonchi  ABDOMEN: Soft, non-tender, non-distended MUSCULOSKELETAL:  No edema; No deformity  SKIN: Warm and dry NEUROLOGIC:  Alert and oriented x 3 PSYCHIATRIC:  Normal affect   ASSESSMENT:    History of syncope No recurrence after his medications were adjusted  Hypertension B/P goal 347-425 systolic  Chronic atrial fibrillation ([facility]) Rate controlled  Anticoagulated On low dose Eliquis  Coronary artery disease Stable  CKD (chronic kidney disease), stage III ([facility]) CRI-3B  PLAN:    No change in Rx- virtual f/u 6 months.    Medication Adjustments/Labs and Tests Ordered: Current medicines are reviewed at length with the patient today.  Concerns regarding medicines are outlined above.  No orders of the defined types were placed in this encounter.  No orders of the defined types were placed in this encounter.   Patient Instructions  Medication Instructions:  Your physician recommends that you continue on your current medications as directed. Please refer to the Current Medication list given to you today. *If you need a refill on your cardiac medications before your next appointment, please call your pharmacy*  Lab Work: NONE  If you have labs (blood work) drawn today and your tests are completely normal, you will receive  your results only by: Marland Kitchen MyChart Message (if you have MyChart) OR . A paper copy in the mail If you have any lab test that is abnormal or we need to change your treatment, we will call you to review the results.  Testing/Procedures: NONE   Follow-Up: At [facility], you and your health needs are our priority.  As part of our continuing mission to provide  you with exceptional heart care, we have created designated Provider Care Teams.  These Care Teams include your primary Cardiologist (physician) and Advanced Practice Providers (APPs -  Physician Assistants and Nurse Practitioners) who all work together to provide you with the care you need, when you need it.  Your next appointment:   6 months  The format for your next appointment:   Virtual Visit   Provider:   Olga Millers, MD  Other Instructions     Signed, Corine Shelter, PA-C  04/21/2019 2:48 PM    [facility]

## 2019-04-21 NOTE — Assessment & Plan Note (Signed)
Stable

## 2019-04-21 NOTE — Patient Instructions (Signed)
Medication Instructions:  Your physician recommends that you continue on your current medications as directed. Please refer to the Current Medication list given to you today. *If you need a refill on your cardiac medications before your next appointment, please call your pharmacy*  Lab Work: NONE  If you have labs (blood work) drawn today and your tests are completely normal, you will receive your results only by: Marland Kitchen MyChart Message (if you have MyChart) OR . A paper copy in the mail If you have any lab test that is abnormal or we need to change your treatment, we will call you to review the results.  Testing/Procedures: NONE   Follow-Up: At [facility], you and your health needs are our priority.  As part of our continuing mission to provide you with exceptional heart care, we have created designated Provider Care Teams.  These Care Teams include your primary Cardiologist (physician) and Advanced Practice Providers (APPs -  Physician Assistants and Nurse Practitioners) who all work together to provide you with the care you need, when you need it.  Your next appointment:   6 months  The format for your next appointment:   Virtual Visit   Provider:   Kirk Ruths, MD  Other Instructions

## 2019-04-21 NOTE — Assessment & Plan Note (Signed)
On low dose Eliquis 

## 2019-04-21 NOTE — Assessment & Plan Note (Signed)
CRI 3B 

## 2019-04-21 NOTE — Assessment & Plan Note (Signed)
No recurrence after his medications were adjusted

## 2019-04-21 NOTE — Assessment & Plan Note (Signed)
Rate controlled 

## 2019-05-07 ENCOUNTER — Other Ambulatory Visit: Payer: Self-pay | Admitting: Cardiology

## 2019-05-08 ENCOUNTER — Other Ambulatory Visit: Payer: Self-pay | Admitting: Cardiology

## 2019-05-08 NOTE — Telephone Encounter (Signed)
°*  STAT* If patient is at the pharmacy, call can be transferred to refill team.   1. Which medications need to be refilled? (please list name of each medication and dose if known) ELIQUIS 2.5 MG TABS tablet  2. Which pharmacy/location (including street and city if local pharmacy) is medication to be sent to? [place], [place]  3. Do they need a 30 day or 90 day supply? 90 day   Patient has medication to last him through Wednesday.

## 2019-05-09 MED ORDER — APIXABAN 2.5 MG PO TABS: 2.5000 mg | ORAL_TABLET | Freq: Two times a day (BID) | ORAL | 0 refills | Status: DC

## 2019-10-29 ENCOUNTER — Other Ambulatory Visit: Payer: Self-pay | Admitting: Cardiology

## 2019-11-01 NOTE — Progress Notes (Signed)
Virtual Visit via Telephone Note   This visit type was conducted due to national recommendations for restrictions regarding the COVID-19 Pandemic (e.g. social distancing) in an effort to limit this patient's exposure and mitigate transmission in our community.  Due to his co-morbid illnesses, this patient is at least at moderate risk for complications without adequate follow up.  This format is felt to be most appropriate for this patient at this time.  The patient did not have access to video technology/had technical difficulties with video requiring transitioning to audio format only (telephone).  All issues noted in this document were discussed and addressed.  No physical exam could be performed with this format.  Please refer to the patient's chart for his  consent to telehealth for [facility].   Date:  11/02/2019   ID:  Vincent Cash Sr., DOB 1922-08-19, MRN [id number]  Patient Location: Home Provider Location: Home  PCP:  Joycelyn Rua, MD  Cardiologist:  Olga Millers, MD  Electrophysiologist:  None   Evaluation Performed:  Follow-Up Visit  Chief Complaint:  Follow up   History of Present Illness:    Vincent KRANTZ Sr. is a 86 y.o. male with we're following for ongoing assessment and management of coronary artery disease remote cath at [facility], he also has a history of persistent atrial fibrillation on low-dose Eliquis.  He did have an episode of syncope in the summer 2020 and diltiazem was discontinued and his diuretic was decreased.  He was last seen in the office by Corine Shelter, PA on 04/21/2019.  He was accompanied by his son.  He was stable, very and active, stays in bed a lot and gets up to use the bathroom.  I am speaking to the patient and his son today. He is inquiring about help with Eliquis cost.  They would like help with this and have requested assistance.  The patient offers no new complaints.  He denies any dizziness, presyncope, bleeding issues, chest  pain, or dyspnea.  He is not very active, but he does go outside and walk in the yard sometimes.  He does not go out in public at all.  His son and daughter-in-law make sure he takes his medications every day, and take him to physician visits.  They do have a blood pressure cuff at home but it is not working correctly and they are going to have it replaced.  The patient does not have symptoms concerning for COVID-19 infection (fever, chills, cough, or new shortness of breath).    Past Medical History:  Diagnosis Date  . Arthritis   . Atrial fibrillation (HCC)   . BPH (benign prostatic hyperplasia)   . CAD (coronary artery disease)   . Dysuria   . GERD (gastroesophageal reflux disease)   . HLD (hyperlipidemia)   . HTN (hypertension)   . Hypothyroid   . Syncope 12/14/2018   Past Surgical History:  Procedure Laterality Date  . Collapsed Lung     x 2  . THYROID SURGERY    . TRANSURETHRAL RESECTION OF PROSTATE       Current Meds  Medication Sig  . acetaminophen (TYLENOL) 650 MG CR tablet Take 650 mg by mouth at bedtime.  Marland Kitchen ELIQUIS 2.5 MG TABS tablet Take 1 tablet by mouth twice daily  . finasteride (PROSCAR) 5 MG tablet Take 0.5 tablets (2.5 mg total) by mouth at bedtime.  . hydrochlorothiazide (MICROZIDE) 12.5 MG capsule Take 1 capsule (12.5 mg total) by mouth as needed.  Marland Kitchen  isosorbide mononitrate (IMDUR) 30 MG 24 hr tablet Take 1 tablet (30 mg total) by mouth daily.  Marland Kitchen levothyroxine (SYNTHROID, LEVOTHROID) 150 MCG tablet Take 150 mcg by mouth daily.   . polyethylene glycol (MIRALAX / GLYCOLAX) 17 g packet Take 17 g by mouth daily after breakfast.  . tamsulosin (FLOMAX) 0.4 MG CAPS capsule Take 1 capsule (0.4 mg total) by mouth daily after supper.     Allergies:   Patient has no known allergies.   Social History   Tobacco Use  . Smoking status: Former Games developer  . Smokeless tobacco: Former Neurosurgeon    Types: Chew  Substance Use Topics  . Alcohol use: No    Alcohol/week: 0.0  standard drinks  . Drug use: No     Family Hx: The patient's family history includes Bladder Cancer in his father; Kidney disease in his father.  ROS:   Please see the history of present illness.    All other systems reviewed and are negative.   Prior CV studies:   The following studies were reviewed today: Echocardiogram 03/18/2015 Left ventricle: The cavity size was normal. Wall thickness was  normal. Systolic function was normal. The estimated ejection  fraction was in the range of 60% to 65%. Wall motion was normal;  there were no regional wall motion abnormalities.   Labs/Other Tests and Data Reviewed:    EKG:  No ECG reviewed.  Recent Labs: 12/14/2018: ALT 20 12/16/2018: BUN 25; Creatinine, Ser 1.77; Hemoglobin 15.0; Platelets 212; Potassium 4.0; Sodium 135   Recent Lipid Panel Lab Results  Component Value Date/Time   CHOL 134 08/11/2018 09:08 AM   TRIG 102 08/11/2018 09:08 AM   HDL 54 08/11/2018 09:08 AM   CHOLHDL 2.5 08/11/2018 09:08 AM   CHOLHDL 2.7 03/18/2015 12:30 PM   LDLCALC 60 08/11/2018 09:08 AM    Wt Readings from Last 3 Encounters:  11/02/19 160 lb (72.6 kg)  04/21/19 161 lb (73 kg)  01/05/19 155 lb (70.3 kg)     Objective:    Vital Signs:  BP 136/84   Pulse 84   Ht 5\' 8"  (1.727 m)   Wt 160 lb (72.6 kg)   BMI 24.33 kg/m    VITAL SIGNS:  reviewed GEN:  no acute distress RESPIRATORY:  normal respiratory effort, symmetric expansion NEURO:  alert and oriented x 3, no obvious focal deficit PSYCH:  normal affect  ASSESSMENT & PLAN:    1. CAD: Continue medical conservative management. He is without cardiac complaints. Labs are drawn by PCP.  No changes in regimen  2. Persistent Atrial Fib:  He remains on Eliquis 2.5 mg daily. He is given information on medication assistance for this. Will will see if we have some samples for him in the meantime. This is costing him over $400 every few months. He is on limited income with social security.    3. Hypertension: Controlled. No changes in medication.   4. HL: Last LDL 96. This was not done fasting. He is not on a statin at this time. PCP is following.   COVID-19 Education: The signs and symptoms of COVID-19 were discussed with the patient and how to seek care for testing (follow up with PCP or arrange E-visit).  The importance of social distancing was discussed today. Patient does not want COVID vaccine as he is socially isolated.   Time:   Today, I have spent 35 minutes with the patient with telehealth technology discussing the above problems.     Medication  Adjustments/Labs and Tests Ordered: Current medicines are reviewed at length with the patient today.  Concerns regarding medicines are outlined above.   Tests Ordered: No orders of the defined types were placed in this encounter.   Medication Changes: No orders of the defined types were placed in this encounter.   Disposition:  Follow up 6 months with Dr. Stanford Breed.   Signed, Phill Myron. West Pugh, ANP, AACC  11/02/2019 12:03 PM    [facility]

## 2019-11-02 ENCOUNTER — Telehealth (INDEPENDENT_AMBULATORY_CARE_PROVIDER_SITE_OTHER): Payer: Medicare Other | Admitting: Adult Health

## 2019-11-02 ENCOUNTER — Encounter: Payer: Self-pay | Admitting: Adult Health

## 2019-11-02 VITALS — BP 136/84 | HR 84 | Ht 68.0 in | Wt 160.0 lb

## 2019-11-02 DIAGNOSIS — I10 Essential (primary) hypertension: Secondary | ICD-10-CM

## 2019-11-02 DIAGNOSIS — I4821 Permanent atrial fibrillation: Secondary | ICD-10-CM | POA: Diagnosis not present

## 2019-11-02 DIAGNOSIS — E7800 Pure hypercholesterolemia, unspecified: Secondary | ICD-10-CM | POA: Diagnosis not present

## 2019-11-02 MED ORDER — ISOSORBIDE MONONITRATE ER 30 MG PO TB24: 30.0000 mg | ORAL_TABLET | Freq: Every day | ORAL | 11 refills | Status: DC

## 2019-11-02 NOTE — Patient Instructions (Signed)

## 2020-01-18 ENCOUNTER — Emergency Department (HOSPITAL_COMMUNITY)
Admission: EM | Admit: 2020-01-18 | Discharge: 2020-01-18 | Disposition: A | Discharge status: Home / Self Care | Payer: Medicare Other | Attending: Emergency Medicine | Admitting: Emergency Medicine

## 2020-01-18 ENCOUNTER — Emergency Department (HOSPITAL_COMMUNITY): Payer: Medicare Other

## 2020-01-18 ENCOUNTER — Other Ambulatory Visit: Payer: Self-pay

## 2020-01-18 DIAGNOSIS — S0100XA Unspecified open wound of scalp, initial encounter: Secondary | ICD-10-CM | POA: Diagnosis not present

## 2020-01-18 DIAGNOSIS — S0990XA Unspecified injury of head, initial encounter: Secondary | ICD-10-CM | POA: Insufficient documentation

## 2020-01-18 DIAGNOSIS — S6992XA Unspecified injury of left wrist, hand and finger(s), initial encounter: Secondary | ICD-10-CM | POA: Diagnosis present

## 2020-01-18 DIAGNOSIS — S61412A Laceration without foreign body of left hand, initial encounter: Secondary | ICD-10-CM

## 2020-01-18 DIAGNOSIS — Z79899 Other long term (current) drug therapy: Secondary | ICD-10-CM | POA: Diagnosis not present

## 2020-01-18 DIAGNOSIS — Z7901 Long term (current) use of anticoagulants: Secondary | ICD-10-CM | POA: Insufficient documentation

## 2020-01-18 DIAGNOSIS — S61422A Laceration with foreign body of left hand, initial encounter: Secondary | ICD-10-CM | POA: Insufficient documentation

## 2020-01-18 DIAGNOSIS — Y999 Unspecified external cause status: Secondary | ICD-10-CM | POA: Diagnosis not present

## 2020-01-18 DIAGNOSIS — Y929 Unspecified place or not applicable: Secondary | ICD-10-CM | POA: Insufficient documentation

## 2020-01-18 DIAGNOSIS — Y939 Activity, unspecified: Secondary | ICD-10-CM | POA: Insufficient documentation

## 2020-01-18 DIAGNOSIS — W19XXXA Unspecified fall, initial encounter: Secondary | ICD-10-CM | POA: Insufficient documentation

## 2020-01-18 LAB — CBC WITH DIFFERENTIAL/PLATELET
Abs Immature Granulocytes: 0.02 10*3/uL (ref 0.00–0.07)
Basophils Absolute: 0 10*3/uL (ref 0.0–0.1)
Basophils Relative: 0 %
Eosinophils Absolute: 0.1 10*3/uL (ref 0.0–0.5)
Eosinophils Relative: 2 %
HCT: 52.2 % — ABNORMAL HIGH (ref 39.0–52.0)
Hemoglobin: 17.3 g/dL — ABNORMAL HIGH (ref 13.0–17.0)
Immature Granulocytes: 0 %
Lymphocytes Relative: 51 %
Lymphs Abs: 4.4 10*3/uL — ABNORMAL HIGH (ref 0.7–4.0)
MCH: 31.5 pg (ref 26.0–34.0)
MCHC: 33.1 g/dL (ref 30.0–36.0)
MCV: 95.1 fL (ref 80.0–100.0)
Monocytes Absolute: 0.5 10*3/uL (ref 0.1–1.0)
Monocytes Relative: 6 %
Neutro Abs: 3.6 10*3/uL (ref 1.7–7.7)
Neutrophils Relative %: 41 %
Platelets: 211 10*3/uL (ref 150–400)
RBC: 5.49 MIL/uL (ref 4.22–5.81)
RDW: 14.3 % (ref 11.5–15.5)
WBC: 8.7 10*3/uL (ref 4.0–10.5)
nRBC: 0 % (ref 0.0–0.2)

## 2020-01-18 LAB — COMPREHENSIVE METABOLIC PANEL
ALT: 19 U/L (ref 0–44)
AST: 24 U/L (ref 15–41)
Albumin: 4.4 g/dL (ref 3.5–5.0)
Alkaline Phosphatase: 67 U/L (ref 38–126)
Anion gap: 13 (ref 5–15)
BUN: 19 mg/dL (ref 8–23)
CO2: 23 mmol/L (ref 22–32)
Calcium: 9.9 mg/dL (ref 8.9–10.3)
Chloride: 102 mmol/L (ref 98–111)
Creatinine, Ser: 1.58 mg/dL — ABNORMAL HIGH (ref 0.61–1.24)
GFR calc Af Amer: 42 mL/min — ABNORMAL LOW (ref >60)
GFR calc non Af Amer: 37 mL/min — ABNORMAL LOW (ref >60)
Glucose, Bld: 104 mg/dL — ABNORMAL HIGH (ref 70–99)
Potassium: 4 mmol/L (ref 3.5–5.1)
Sodium: 138 mmol/L (ref 135–145)
Total Bilirubin: 1 mg/dL (ref 0.3–1.2)
Total Protein: 8.2 g/dL — ABNORMAL HIGH (ref 6.5–8.1)

## 2020-01-18 LAB — LACTIC ACID, PLASMA: Lactic Acid, Venous: 1.3 mmol/L (ref 0.5–1.9)

## 2020-01-18 MED ORDER — HYDROCHLOROTHIAZIDE 25 MG PO TABS
25.0000 mg | ORAL_TABLET | Freq: Every day | ORAL | Status: DC
  Administered 2020-01-18: 25 mg via ORAL
  Filled 2020-01-18: qty 1

## 2020-01-18 MED ORDER — ISOSORBIDE MONONITRATE ER 30 MG PO TB24
30.0000 mg | ORAL_TABLET | Freq: Every day | ORAL | Status: DC
  Administered 2020-01-18: 30 mg via ORAL
  Filled 2020-01-18: qty 1

## 2020-01-18 NOTE — ED Notes (Signed)
Pt transported to CT ?

## 2020-01-18 NOTE — ED Triage Notes (Signed)
Pt arrives from home where he had an unwitnessed fall, denies LOC. Pt has laceration on back of head on left side. Pt alert and oriented. HTN with EMS. Pt on Eliquis.

## 2020-01-18 NOTE — ED Notes (Signed)
RN ambulated pt in hallway with assistance. Pt's gait was steady with assistance and pt denied any dizziness.

## 2020-01-18 NOTE — ED Provider Notes (Signed)
[facility] EMERGENCY DEPARTMENT Provider Note   CSN: [id number] Arrival date & time: 01/18/20  0348     History Chief Complaint  Patient presents with  . LVL 2 Fall    Vincent [facility] Sr. is a 86 y.o. male.   Fall This is a new problem. The current episode started 1 to 2 hours ago. The problem occurs constantly. The problem has not changed since onset.Pertinent negatives include no chest pain, no abdominal pain and no headaches. Nothing aggravates the symptoms. Nothing relieves the symptoms. He has tried nothing for the symptoms. The treatment provided no relief.       No past medical history on file.  There are no problems to display for this patient.   Past Surgical History:  Procedure Laterality Date  . Collapsed Lung     x 2  . THYROID SURGERY    . TRANSURETHRAL RESECTION OF PROSTATE         No family history on file.  Social History   Tobacco Use  . Smoking status: Not on file  Substance Use Topics  . Alcohol use: Not on file  . Drug use: Not on file    Home Medications Prior to Admission medications   Medication Sig Start Date End Date Taking? Authorizing Provider  acetaminophen (TYLENOL) 325 MG tablet Take 650 mg by mouth every 6 (six) hours as needed for mild pain.   Yes [provider]  ELIQUIS 2.5 MG TABS tablet Take 2.5 mg by mouth 2 (two) times daily. 11/02/19  Yes [provider]  EUTHYROX 150 MCG tablet Take 150 mcg by mouth daily. 11/04/19  Yes [provider]  isosorbide mononitrate (IMDUR) 30 MG 24 hr tablet Take 30 mg by mouth daily. 12/30/19  Yes [provider]  tamsulosin (FLOMAX) 0.4 MG CAPS capsule Take 0.4 mg by mouth every evening. 12/01/19  Yes [provider]    Allergies    Patient has no known allergies.  Review of Systems   Review of Systems  Cardiovascular: Negative for chest pain.  Gastrointestinal: Negative for abdominal pain.  Neurological: Negative for  headaches.  All other systems reviewed and are negative.   Physical Exam Updated Vital Signs BP (!) 196/113   Pulse 80   Temp 98.3 F (36.8 C) (Oral)   Resp 14   Ht 5\' 8"  (1.727 m)   Wt 68 kg   SpO2 99%   BMI 22.81 kg/m   Physical Exam Vitals and nursing note reviewed.  Constitutional:      Appearance: He is well-developed.  HENT:     Head: Normocephalic.     Comments: Area of small partial thickness skin tear to left crown of scalp    Nose: No congestion or rhinorrhea.     Mouth/Throat:     Mouth: Mucous membranes are moist.     Pharynx: Oropharynx is clear.  Eyes:     Conjunctiva/sclera: Conjunctivae normal.     Pupils: Pupils are equal, round, and reactive to light.  Cardiovascular:     Rate and Rhythm: Normal rate.  Pulmonary:     Effort: Pulmonary effort is normal. No respiratory distress.  Abdominal:     General: There is no distension.  Musculoskeletal:        General: No swelling or tenderness. Normal range of motion.     Cervical back: Normal range of motion.  Skin:    General: Skin is warm and dry.     Coloration: Skin  is not jaundiced or pale.     Comments: Small flap shaped laceration to left palm  Neurological:     General: No focal deficit present.     Mental Status: He is alert.     ED Results / Procedures / Treatments   Labs (all labs ordered are listed, but only abnormal results are displayed) Labs Reviewed  CBC WITH DIFFERENTIAL/PLATELET - Abnormal; Notable for the following components:      Result Value   Hemoglobin 17.3 (*)    HCT 52.2 (*)    Lymphs Abs 4.4 (*)    All other components within normal limits  COMPREHENSIVE METABOLIC PANEL - Abnormal; Notable for the following components:   Glucose, Bld 104 (*)    Creatinine, Ser 1.58 (*)    Total Protein 8.2 (*)    GFR calc non Af Amer 37 (*)    GFR calc Af Amer 42 (*)    All other components within normal limits  LACTIC ACID, PLASMA    EKG None  Radiology CT Head Wo  Contrast  Result Date: 01/18/2020 CLINICAL DATA:  Minor head trauma EXAM: CT HEAD WITHOUT CONTRAST CT CERVICAL SPINE WITHOUT CONTRAST TECHNIQUE: Multidetector CT imaging of the head and cervical spine was performed following the standard protocol without intravenous contrast. Multiplanar CT image reconstructions of the cervical spine were also generated. COMPARISON:  01/05/2019 FINDINGS: CT HEAD FINDINGS Brain: No evidence of acute infarction, hemorrhage, hydrocephalus, extra-axial collection or mass lesion/mass effect. Moderately extensive patchy right cerebellar infarction since prior, but chronic appearing. Cerebral volume loss and white matter low-density. Vascular: No hyperdense vessel or unexpected calcification. Skull: Negative for fracture Sinuses/Orbits: No evidence of injury CT CERVICAL SPINE FINDINGS Alignment: No traumatic malalignment Skull base and vertebrae: No acute fracture. No primary bone lesion or focal pathologic process. Soft tissues and spinal canal: No prevertebral fluid or swelling. No visible canal hematoma. Disc levels: Generalized cervical disc narrowing and endplate degeneration. Milder facet osteoarthritis with spurring. Upper chest: Apical emphysema IMPRESSION: 1. No evidence of acute intracranial or cervical spine injury. 2. 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 Electronically Signed   By: Marnee Spring M.D.   On: 01/18/2020 04:43   CT Cervical Spine Wo Contrast  Result Date: 01/18/2020 CLINICAL DATA:  Minor head trauma EXAM: CT HEAD WITHOUT CONTRAST CT CERVICAL SPINE WITHOUT CONTRAST TECHNIQUE: Multidetector CT imaging of the head and cervical spine was performed following the standard protocol without intravenous contrast. Multiplanar CT image reconstructions of the cervical spine were also generated. COMPARISON:  01/05/2019 FINDINGS: CT HEAD FINDINGS Brain: No evidence of acute infarction, hemorrhage,  hydrocephalus, extra-axial collection or mass lesion/mass effect. Moderately extensive patchy right cerebellar infarction since prior, but chronic appearing. Cerebral volume loss and white matter low-density. Vascular: No hyperdense vessel or unexpected calcification. Skull: Negative for fracture Sinuses/Orbits: No evidence of injury CT CERVICAL SPINE FINDINGS Alignment: No traumatic malalignment Skull base and vertebrae: No acute fracture. No primary bone lesion or focal pathologic process. Soft tissues and spinal canal: No prevertebral fluid or swelling. No visible canal hematoma. Disc levels: Generalized cervical disc narrowing and endplate degeneration. Milder facet osteoarthritis with spurring. Upper chest: Apical emphysema IMPRESSION: 1. No evidence of acute intracranial or cervical spine injury. 2. 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 Electronically Signed   By: Marnee Spring M.D.   On: 01/18/2020 04:43    Procedures .Marland KitchenLaceration Repair  Date/Time: 01/20/2020  12:10 AM Performed by: Marily Memos, MD Authorized by: Marily Memos, MD   Consent:    Consent obtained:  Verbal   Consent given by:  Patient   Risks discussed:  Infection, need for additional repair, nerve damage, poor wound healing, poor cosmetic result, pain, retained foreign body, tendon damage and vascular damage   Alternatives discussed:  No treatment, delayed treatment and observation Anesthesia (see MAR for exact dosages):    Anesthesia method:  Local infiltration   Local anesthetic:  Lidocaine 1% WITH epi Laceration details:    Location:  Hand   Hand location:  L palm   Length (cm):  3   Depth (mm):  3 Repair type:    Repair type:  Simple Pre-procedure details:    Preparation:  Patient was prepped and draped in usual sterile fashion Exploration:    Wound exploration: wound explored through full range of motion and entire depth of wound probed and  visualized   Treatment:    Area cleansed with:  Saline   Amount of cleaning:  Extensive   Irrigation solution:  Sterile water   Irrigation method:  Syringe   Visualized foreign bodies/material removed: no   Skin repair:    Repair method:  Sutures   Suture size:  4-0   Suture material:  Prolene   Suture technique:  Simple interrupted   Number of sutures:  3 Approximation:    Approximation:  Close Post-procedure details:    Dressing:  Antibiotic ointment and non-adherent dressing   Patient tolerance of procedure:  Tolerated well, no immediate complications   (including critical care time)  Medications Ordered in ED Medications - No data to display  ED Course  I have reviewed the triage vital signs and the nursing notes.  Pertinent labs & imaging results that were available during my care of the patient were reviewed by me and considered in my medical decision making (see chart for details).    MDM Rules/Calculators/A&P                          Level 2 mechanical fall because he is on Eliquis.  Patient without any significant injuries secondary to the fall did have a skin tear to his scalp that nursing took care of but also had a laceration to his left hand which I placed sutures as above.  Was hypertensive however in the setting I think that is probably understandable.  No indication of hypertensive emergency at this time.  Gave him his home medications.  Will return for suture removal.  Final Clinical Impression(s) / ED Diagnoses Final diagnoses:  Fall, initial encounter  Laceration of left hand, foreign body presence unspecified, initial encounter    Rx / DC Orders ED Discharge Orders    None       Geron Mulford, Barbara Cower, MD 01/20/20 [phone]

## 2020-01-18 NOTE — ED Notes (Signed)
Family informed this Clinical research associate that they are uncomfortable take pt home with his blood pressure so high.

## 2020-01-18 NOTE — Progress Notes (Signed)
Orthopedic Tech Progress Note Patient Details:  Vincent Longton Sr. Dec 02, 1922 [id number] Level 2 Trauma Patient ID: Vincent Gulling Sr., male   DOB: 1923/05/16, 86 y.o.   MRN: [id number]   Vincent [facility] 01/18/2020, 5:01 AM

## 2020-01-18 NOTE — ED Notes (Signed)
Discharge instructions reviewed with pt. Pt verbalized understanding.   

## 2020-01-18 NOTE — Progress Notes (Signed)
Chaplain responded to this Level II fall.  Patient being evaluated.  Patient lives with son and daughter in law who are on the way.  No needs at this time, Chaplain available as needed. Chaplain Agustin Cree. MDiv.    01/18/20 0400  Clinical Encounter Type  Visited With Health care provider  Visit Type Trauma  Referral From Nurse  Consult/Referral To Chaplain

## 2020-01-23 ENCOUNTER — Other Ambulatory Visit: Payer: Self-pay | Admitting: Cardiology

## 2020-01-26 ENCOUNTER — Telehealth: Payer: Self-pay | Admitting: Cardiology

## 2020-01-26 NOTE — Telephone Encounter (Signed)
Spoke with son and will scan to his email a patient assistance application: [email or web address] Advised that Dr. Jens Som will be in office 2 days next week and to return then so it can be submitted

## 2020-01-26 NOTE — Telephone Encounter (Signed)
Pt c/o medication issue:  1. Name of Medication: ELIQUIS 2.5 MG TABS tablet  2. How are you currently taking this medication (dosage and times per day)? As directed  3. Are you having a reaction (difficulty breathing--STAT)? No   4. What is your medication issue? Patient's son is calling to inquire about whether patient may be eligible for assistance with purchasing medication or an alternative. He states the patient is no longer able to afford the medication. Please call to advise. Call may be returned to [phone] or [phone].

## 2020-01-30 NOTE — Telephone Encounter (Signed)
Spoke with pt wife, questions regarding [employer] patient assistance answered. They are going to fax the completed form to [place].

## 2020-01-30 NOTE — Telephone Encounter (Signed)
Vincent [facility]'s son is calling requesting a nurse call his wife Vincent Mendez to go over the paperwork received for the discount on Eliquis. He states he was unable to read all of the paperwork so he gave it to his wife. She is ready to fax over the forms once the questions they have in regards to it are answered. Vincent Mendez best contact number is [phone]. Vincent M[facility] states if she does not answer you can reach out to him as well in regards to it. Please advise.

## 2020-02-02 NOTE — Telephone Encounter (Signed)
Patient's daughter in law is calling to speak with Stanton Kidney. She wants to know how long it normally takes to hear back from [facility]. She states Mr. Bonebrake only has enough medication to last him until Sunday.

## 2020-02-02 NOTE — Telephone Encounter (Signed)
Spoke with becky, aware application was faxed to the company today. They will get a particle refill.

## 2020-05-13 ENCOUNTER — Telehealth: Payer: Self-pay | Admitting: Cardiology

## 2020-05-13 NOTE — Telephone Encounter (Signed)
New message:    Patient son calling stating him and his father will be moving out of state soon. They would like to get apt next week. There is not many apt's left but they would like be see soon. Please call

## 2020-05-13 NOTE — Telephone Encounter (Signed)
Nedra Hai is returning Christine's call. Please advise.

## 2020-05-13 NOTE — Telephone Encounter (Signed)
Lm to call back ./cy 

## 2020-05-13 NOTE — Telephone Encounter (Signed)
Spoke to patients son Vincent [facility] (okay per [facility]) who is calling in to reschedule patients yearly follow up. Patients son Vincent Mendez states that Vincent Mendez is moving to [place] in the next 3 weeks and would like to schedule an appointment for Vincent Mendez to be see by Dr. Jens Som or APP. Advised patients son that there was an appointment available this Thursday with Dr. Jens Som but Vincent Mendez un able to come to that due to traveling out of town. Made Vincent Mendez an appointment for 12/15 at 10:45 with Vincent Course PA who he has seen in the past. Patients son states that Vincent Mendez is not currently having any issues but that he just wanted to make Vincent Mendez an appointment to be seen prior to moving. Advised patients son of instructions for appointment time and date. Verbalized understanding.

## 2020-05-29 ENCOUNTER — Other Ambulatory Visit: Payer: Self-pay

## 2020-05-29 ENCOUNTER — Ambulatory Visit (INDEPENDENT_AMBULATORY_CARE_PROVIDER_SITE_OTHER): Payer: Medicare Other | Admitting: Physician Assistant

## 2020-05-29 ENCOUNTER — Encounter: Payer: Self-pay | Admitting: Physician Assistant

## 2020-05-29 VITALS — BP 136/82 | HR 96 | Ht 66.0 in | Wt 156.0 lb

## 2020-05-29 DIAGNOSIS — I4811 Longstanding persistent atrial fibrillation: Secondary | ICD-10-CM | POA: Diagnosis not present

## 2020-05-29 DIAGNOSIS — R55 Syncope and collapse: Secondary | ICD-10-CM

## 2020-05-29 DIAGNOSIS — I10 Essential (primary) hypertension: Secondary | ICD-10-CM | POA: Diagnosis not present

## 2020-05-29 DIAGNOSIS — E039 Hypothyroidism, unspecified: Secondary | ICD-10-CM | POA: Diagnosis not present

## 2020-05-29 DIAGNOSIS — Z8673 Personal history of transient ischemic attack (TIA), and cerebral infarction without residual deficits: Secondary | ICD-10-CM

## 2020-05-29 DIAGNOSIS — I2510 Atherosclerotic heart disease of native coronary artery without angina pectoris: Secondary | ICD-10-CM

## 2020-05-29 NOTE — Patient Instructions (Signed)
Medication Instructions:  Your physician recommends that you continue on your current medications as directed. Please refer to the Current Medication list given to you today.  *If you need a refill on your cardiac medications before your next appointment, please call your pharmacy*  Lab Work: NONE ordered at this time of appointment   If you have labs (blood work) drawn today and your tests are completely normal, you will receive your results only by: . MyChart Message (if you have MyChart) OR . A paper copy in the mail If you have any lab test that is abnormal or we need to change your treatment, we will call you to review the results.  Testing/Procedures: NONE ordered at this time of appointment   Follow-Up: At [facility], you and your health needs are our priority.  As part of our continuing mission to provide you with exceptional heart care, we have created designated Provider Care Teams.  These Care Teams include your primary Cardiologist (physician) and Advanced Practice Providers (APPs -  Physician Assistants and Nurse Practitioners) who all work together to provide you with the care you need, when you need it.  We recommend signing up for the patient portal called "MyChart".  Sign up information is provided on this After Visit Summary.  MyChart is used to connect with patients for Virtual Visits (Telemedicine).  Patients are able to view lab/test results, encounter notes, upcoming appointments, etc.  Non-urgent messages can be sent to your provider as well.   To learn more about what you can do with MyChart, go to [email or web address].    Your next appointment:   6 month(s)  The format for your next appointment:   In Person  Provider:   Brian Crenshaw, MD  Other Instructions   

## 2020-05-29 NOTE — Progress Notes (Signed)
Cardiology Office Note:    Date:  05/29/2020   ID:  Vincent Cash Sr., DOB 1923-01-01, MRN [id number]  PCP:  Joycelyn Rua, MD  [facility] Cardiologist:  Olga Millers, MD  [facility] Electrophysiologist:  None   Referring MD: Joycelyn Rua, MD   Chief Complaint  Patient presents with  . Follow-up    Annual follow up, seen for Dr. Jens Som.    History of Present Illness:    Vincent LEMBCKE Sr. is a 86 y.o. male with a hx of CAD, longstanding persistent atrial fibrillation, BPH, HTN, HLD, hypothyroidism, CVA and syncope.  Patient had a cardiac catheterization at [facility] 20 years ago.  He presented with atrial fibrillation and syncope in October 2016.  The syncope was felt to be related to micturition. He converted to sinus rhythm spontaneously.  EF was 60 to 65% of the time.  In November 2016, he was back in A. fib and had lower extremity edema.  His Procardia was changed to diltiazem and 3 times weekly dose of the hydrochlorothiazide was added.  He has been on Eliquis for anticoagulation purposes.  He was admitted on 12/14/2018 with syncope.  He was sitting at a table when he slowly leaned forward and subsequently lost consciousness.  Total time down was 5 minutes.  He reportedly woke up after being lowered to the ground.  It was felt he likely experienced vasovagal syncope in the setting of multiple high antihypertensive agents.  He was orthostatic at the time.  Telemetry demonstrated atrial fibrillation which was self rate controlled.  His isosorbide was decreased and diltiazem was stopped. His systolic blood pressure goal was set to 140-150 mmHg.  He returned on 12/16/2018 with recurrent presyncope.  He had no further orthostatic hypotension however was mildly tachycardic upon standing.  It was felt that he likely was dehydrated.  I did advise him to switch his hydrochlorothiazide Monday Wednesday Friday to as needed only for swelling.  The only blood  pressure medication he is currently taking is 30 mg daily of isosorbide mononitrate.  Since our last visit, patient went to the ED August 2021 after a fall.  CT of the head was negative for acute process however did reveal interval chronic CVA when compared to the previous CT in 2020.  Patient presents today for follow-up.  He is moving in with his son in [place] which is 20-minute driving distance [place] downtown [place]. His son is still planning to drive the patient to [facility] occasionally for his cardiology follow-up, however I also printed out his previous office visit just in case he needs to be seen locally as well.  It would be a good idea for him to establish with a local cardiologist.  He asked for recommendation by Dr. Jens Som.  Otherwise he denies any recent chest pain.  He does walk around with a cane.  We discussed the importance of getting up slowly in the morning to avoid significant dizzy spell.  He has not had any syncope since last year.  Heart rate and blood pressure are both very well controlled.  He has no lower extremity edema, orthopnea or PND.  He does have some balance issue and dyspnea with more strenuous activity.  Overall, he is doing very well for 86 year old gentleman.  A portion of above note was dictated using Scientist, clinical (histocompatibility and immunogenetics).   Past Medical History:  Diagnosis Date  . Arthritis   . Atrial fibrillation (HCC)   .  BPH (benign prostatic hyperplasia)   . CAD (coronary artery disease)   . Dysuria   . GERD (gastroesophageal reflux disease)   . HLD (hyperlipidemia)   . HTN (hypertension)   . Hypothyroid   . Syncope 12/14/2018    Past Surgical History:  Procedure Laterality Date  . Collapsed Lung     x 2  . THYROID SURGERY    . TRANSURETHRAL RESECTION OF PROSTATE      Current Medications: Current Meds  Medication Sig  . acetaminophen (TYLENOL) 325 MG tablet Take 650 mg by mouth every 6 (six) hours as needed for mild pain.   Marland Kitchen ELIQUIS 2.5 MG TABS tablet Take 2.5 mg by mouth 2 (two) times daily.  . EUTHYROX 150 MCG tablet Take 150 mcg by mouth daily.  . finasteride (PROSCAR) 5 MG tablet Take 0.5 tablets (2.5 mg total) by mouth at bedtime.  . hydrochlorothiazide (MICROZIDE) 12.5 MG capsule Take 1 capsule (12.5 mg total) by mouth as needed.  . isosorbide mononitrate (IMDUR) 30 MG 24 hr tablet Take 30 mg by mouth daily.  Marland Kitchen levothyroxine (SYNTHROID, LEVOTHROID) 150 MCG tablet Take 150 mcg by mouth daily.   . polyethylene glycol (MIRALAX / GLYCOLAX) 17 g packet Take 17 g by mouth daily after breakfast.  . tamsulosin (FLOMAX) 0.4 MG CAPS capsule Take 0.4 mg by mouth every evening.     Allergies:   Patient has no known allergies.   Social History   Socioeconomic History  . Marital status: Married    Spouse name: Not on file  . Number of children: Not on file  . Years of education: Not on file  . Highest education level: Not on file  Occupational History  . Not on file  Tobacco Use  . Smoking status: Former Games developer  . Smokeless tobacco: Former Neurosurgeon    Types: Engineer, drilling  . Vaping Use: Never used  Substance and Sexual Activity  . Alcohol use: No    Alcohol/week: 0.0 standard drinks  . Drug use: No  . Sexual activity: Not on file  Other Topics Concern  . Not on file  Social History Narrative  . Not on file   Social Determinants of Health   Financial Resource Strain: Not on file  Food Insecurity: Not on file  Transportation Needs: Not on file  Physical Activity: Not on file  Stress: Not on file  Social Connections: Not on file     Family History: The patient's family history includes Bladder Cancer in his father; Kidney disease in his father.  ROS:   Please see the history of present illness.     All other systems reviewed and are negative.  EKGs/Labs/Other Studies Reviewed:    The following studies were reviewed today:  Echo 03/18/2015 LV EF: 60% -  65%    -------------------------------------------------------------------  Indications:   Atrial fibrillation - 427.31.   -------------------------------------------------------------------  History:  PMH:  Syncope. Coronary artery disease. Risk factors:  Hypertension. Dyslipidemia.   -------------------------------------------------------------------  Study Conclusions   - Left ventricle: The cavity size was normal. Wall thickness was  normal. Systolic function was normal. The estimated ejection  fraction was in the range of 60% to 65%. Wall motion was normal;  there were no regional wall motion abnormalities.   EKG:  EKG is ordered today.  The ekg ordered today demonstrates atrial fibrillation, no significant ST changes, self rate controlled, heart rate 80 bpm  Recent Labs: 01/18/2020: ALT 19; BUN 19; Creatinine, Ser 1.58; Hemoglobin  17.3; Platelets 211; Potassium 4.0; Sodium 138  Recent Lipid Panel    Component Value Date/Time   CHOL 134 08/11/2018 0908   TRIG 102 08/11/2018 0908   HDL 54 08/11/2018 0908   CHOLHDL 2.5 08/11/2018 0908   CHOLHDL 2.7 03/18/2015 1230   VLDL 7 03/18/2015 1230   LDLCALC 60 08/11/2018 0908     Risk Assessment/Calculations:     CHA2DS2-VASc Score = 6  This indicates a 9.7% annual risk of stroke. The patient's score is based upon: CHF History: No HTN History: Yes Diabetes History: No Stroke History: Yes Vascular Disease History: Yes Age Score: 2 Gender Score: 0   Physical Exam:    VS:  BP 136/82   Pulse 96   Ht 5\' 6"  (1.676 m)   Wt 156 lb (70.8 kg)   SpO2 93%   BMI 25.18 kg/m     Wt Readings from Last 3 Encounters:  05/29/20 156 lb (70.8 kg)  01/18/20 150 lb (68 kg)  11/02/19 160 lb (72.6 kg)     GEN:  Well nourished, well developed in no acute distress HEENT: Normal NECK: No JVD; No carotid bruits LYMPHATICS: No lymphadenopathy CARDIAC: Irregularly irregular, no murmurs, rubs, gallops RESPIRATORY:  Clear to  auscultation without rales, wheezing or rhonchi  ABDOMEN: Soft, non-tender, non-distended MUSCULOSKELETAL:  No edema; No deformity  SKIN: Warm and dry NEUROLOGIC:  Alert and oriented x 3 PSYCHIATRIC:  Normal affect   ASSESSMENT:    1. Coronary artery disease involving native coronary artery of native heart without angina pectoris   2. Longstanding persistent atrial fibrillation (HCC)   3. Essential hypertension   4. Hypothyroidism, unspecified type   5. Syncope and collapse   6. H/O: CVA (cerebrovascular accident)    PLAN:    In order of problems listed above:  1. CAD: Remote cardiac catheterization over 20 years ago in the [facility], no further cardiac issues since.  Echocardiogram obtained in October 2016 showed normal EF.  2. Longstanding persistent atrial fibrillation: On low-dose Eliquis 2.5 mg twice a day.  He is not on any rate control medication.  Heart rate self rate controlled.  Previous diltiazem was discontinued due to syncope and orthostatic hypotension.  3. Hypertension: The only blood pressure medication he is still on is 30 mg daily of Imdur.  Hydrochlorothiazide as needed for leg swelling.  4. Hypothyroidism: On levothyroxine  5. History of syncope: First occurrence in 2016 in the setting of micturition.  Had a recurrent syncope x2 in 2010, first episode was in the setting of vasovagal and orthostatic hypotension, second episode occurred in the setting of dehydration.  No recurrence of syncope in the past year.  Given history of orthostatic hypotension, avoid aggressive blood pressure control.  Instructed the patient to get up slowly in the morning and use a cane for balance  6. History of CVA: Relatively new diagnosis, incidental finding based on finding of CT of head in August 2021.  Not on aspirin given the need for Eliquis.        Medication Adjustments/Labs and Tests Ordered: Current medicines are reviewed at length with the patient today.   Concerns regarding medicines are outlined above.  Orders Placed This Encounter  Procedures  . EKG 12-Lead   No orders of the defined types were placed in this encounter.   Patient Instructions  Medication Instructions:  Your physician recommends that you continue on your current medications as directed. Please refer to the Current Medication list given to  you today.  *If you need a refill on your cardiac medications before your next appointment, please call your pharmacy*  Lab Work: NONE ordered at this time of appointment   If you have labs (blood work) drawn today and your tests are completely normal, you will receive your results only by: Marland Kitchen MyChart Message (if you have MyChart) OR . A paper copy in the mail If you have any lab test that is abnormal or we need to change your treatment, we will call you to review the results.  Testing/Procedures: NONE ordered at this time of appointment   Follow-Up: At [facility], you and your health needs are our priority.  As part of our continuing mission to provide you with exceptional heart care, we have created designated Provider Care Teams.  These Care Teams include your primary Cardiologist (physician) and Advanced Practice Providers (APPs -  Physician Assistants and Nurse Practitioners) who all work together to provide you with the care you need, when you need it.  We recommend signing up for the patient portal called "MyChart".  Sign up information is provided on this After Visit Summary.  MyChart is used to connect with patients for Virtual Visits (Telemedicine).  Patients are able to view lab/test results, encounter notes, upcoming appointments, etc.  Non-urgent messages can be sent to your provider as well.   To learn more about what you can do with MyChart, go to [email or web address].    Your next appointment:   6 month(s)  The format for your next appointment:   In Person  Provider:   Olga Millers, MD  Other  Instructions      Signed, Azalee Course, PA  05/29/2020 11:43 AM    [facility]

## 2020-06-20 ENCOUNTER — Telehealth: Payer: Self-pay | Admitting: Cardiology

## 2020-06-20 NOTE — Telephone Encounter (Signed)
Spoke with becky, aware 5 mg samples placed at the front desk for pick up. She voiced understanding that the patient should take 1/2 tablet twice daily.

## 2020-06-20 NOTE — Telephone Encounter (Signed)
Patient calling the office for samples of medication:   1.  What medication and dosage are you requesting samples for? ELIQUIS 2.5 MG TABS tablet  2.  Are you currently out of this medication? No, has 1 week worth of medication left

## 2020-07-04 ENCOUNTER — Other Ambulatory Visit: Payer: Self-pay

## 2020-07-04 ENCOUNTER — Telehealth: Payer: Self-pay | Admitting: Cardiology

## 2020-07-04 MED ORDER — ELIQUIS 2.5 MG PO TABS: 2.5000 mg | ORAL_TABLET | Freq: Two times a day (BID) | ORAL | 5 refills | Status: AC

## 2020-07-04 NOTE — Telephone Encounter (Signed)
*  STAT* If patient is at the pharmacy, call can be transferred to refill team.   1. Which medications need to be refilled? (please list name of each medication and dose if known)  ELIQUIS 2.5 MG TABS tablet  2. Which pharmacy/location (including street and city if local pharmacy) is medication to be sent to? [id number] - [place], [place] - [address]  3. Do they need a 30 day or 90 day supply? 90 with refills   Patient moved to [facility] and the [facility] is not close

## 2020-07-04 NOTE — Telephone Encounter (Signed)
Prescription refill request for Eliquis received. Indication:atrial fibrillation Last office visit:12/21 meng Scr:1.6 11/21 Age: 87 Weight:70.8 kg  Prescription refilled

## 2020-12-04 ENCOUNTER — Ambulatory Visit: Payer: Medicare Other | Admitting: Cardiology

## 2021-03-13 NOTE — Telephone Encounter (Signed)
Error

## 2022-05-27 IMAGING — CT CT HEAD W/O CM
4 series · 16 of 47 positions shown, 18 images · non-contrast
Comparison: 01/05/2019

CLINICAL DATA: Minor head trauma

EXAM:
CT HEAD WITHOUT CONTRAST
CT CERVICAL SPINE WITHOUT CONTRAST
TECHNIQUE: Multidetector CT imaging of the head and cervical spine was
performed following the standard protocol without intravenous
contrast. Multiplanar CT image reconstructions of the cervical spine
were also generated.

[Series 3: head wo · axial · 0.45mm/px · z∈[-96,+30]mm · 7 of 35 slices shown, 9 images]
[im 5/35  brain]
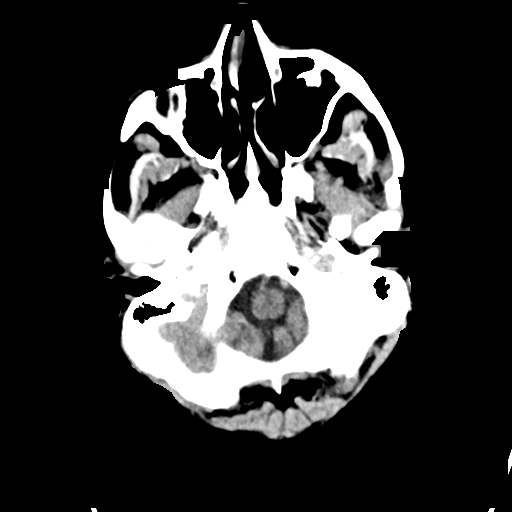
[im 5/35  bone]
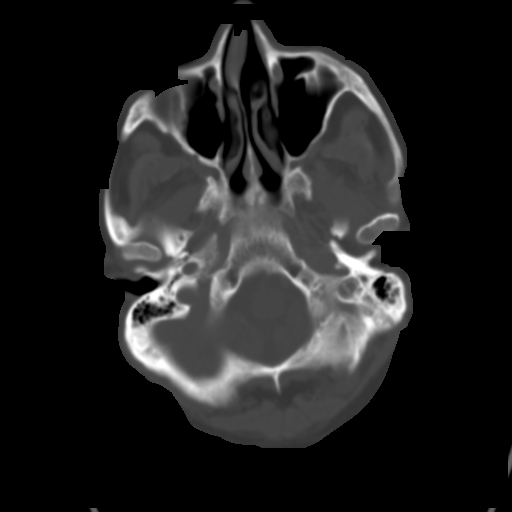
[im 9/35  brain]
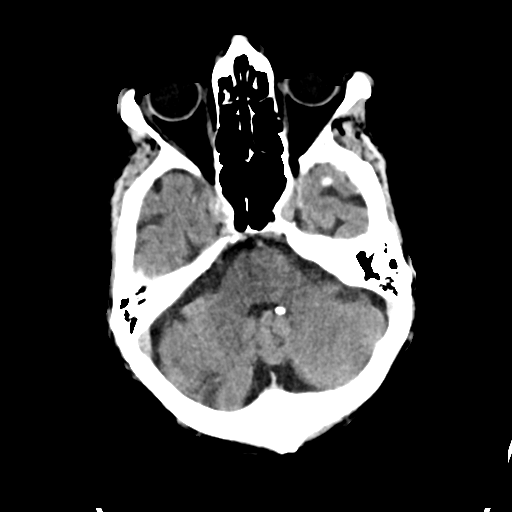
[im 13/35  brain]
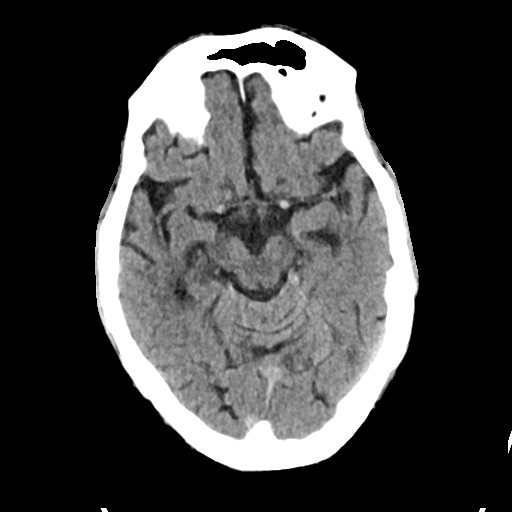
[im 18/35  brain]
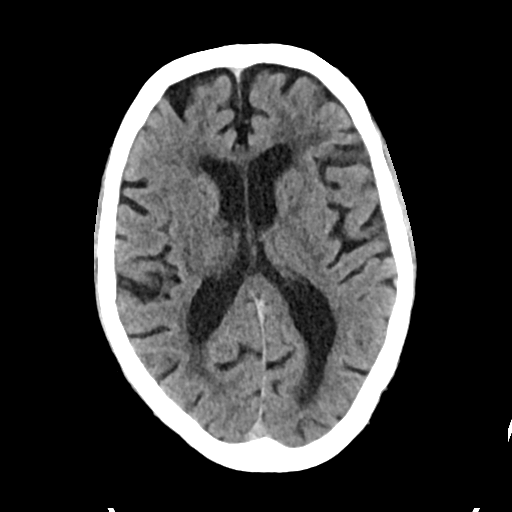
[im 22/35  brain]
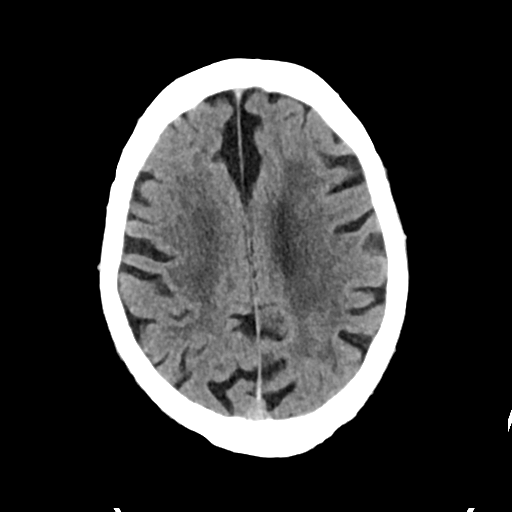
[im 22/35  bone]
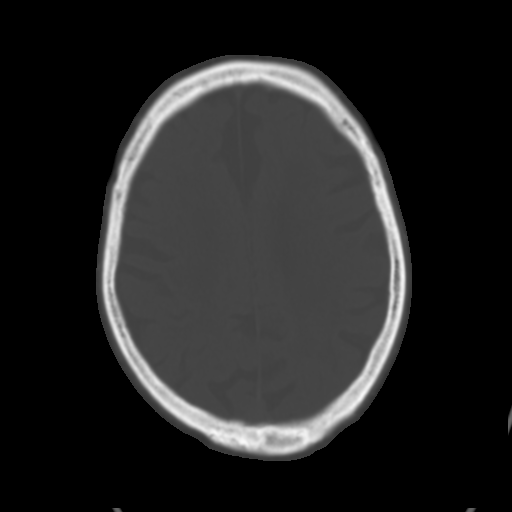
[im 26/35  brain]
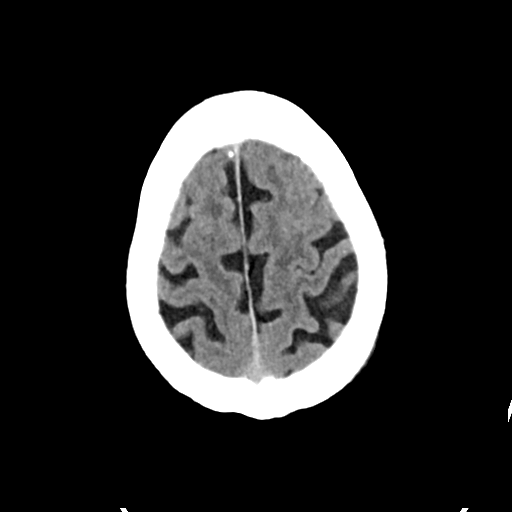
[im 30/35  brain]
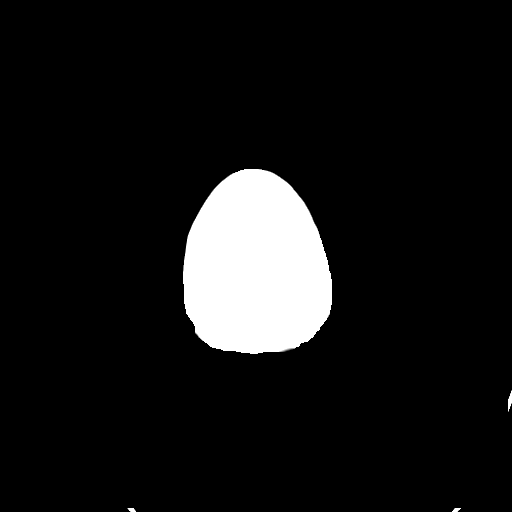

[Series 4: head bone · axial · 0.45mm/px · z∈[-100,-66]mm · 3 of 86 slices shown]
[im 9/86  bone]
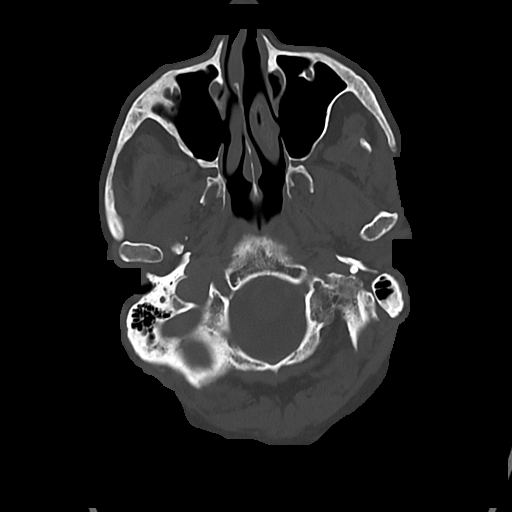
[im 18/86  bone]
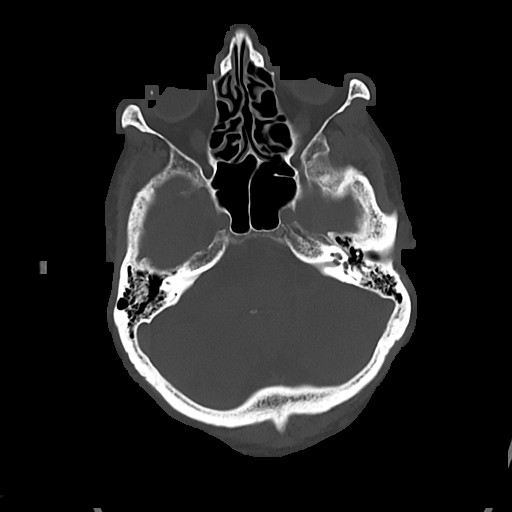
[im 26/86  bone]
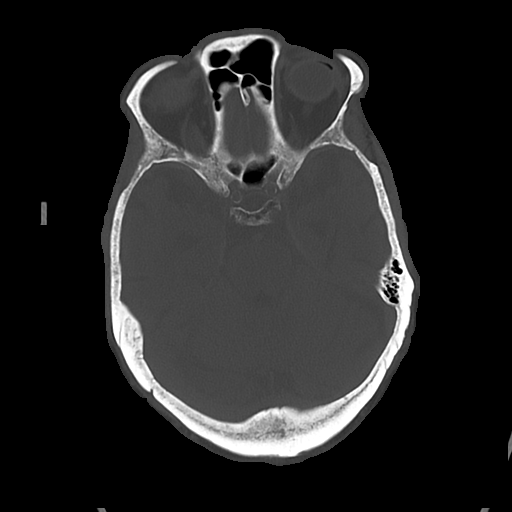

[Series 5: cor soft · coronal · 0.31mm/px · 3 of 72 slices shown]
[im 24/72  brain]
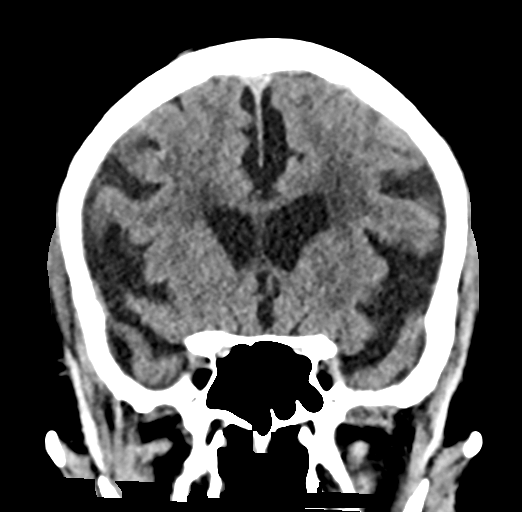
[im 32/72  brain]
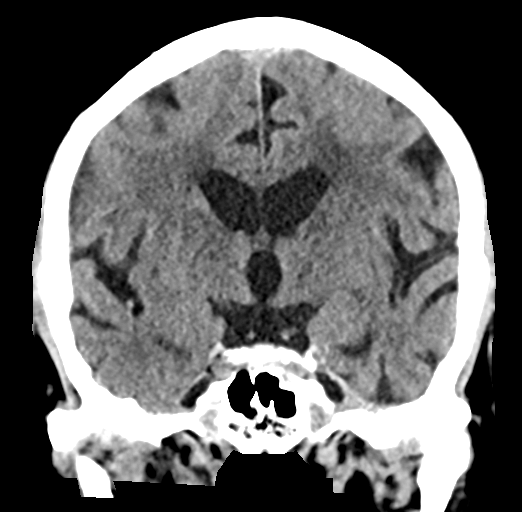
[im 40/72  brain]
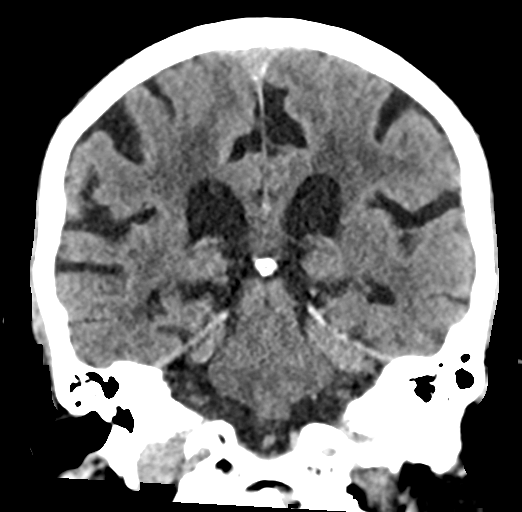

[Series 6: sag soft · sagittal · 0.31mm/px · 3 of 55 slices shown]
[im 19/55  brain]
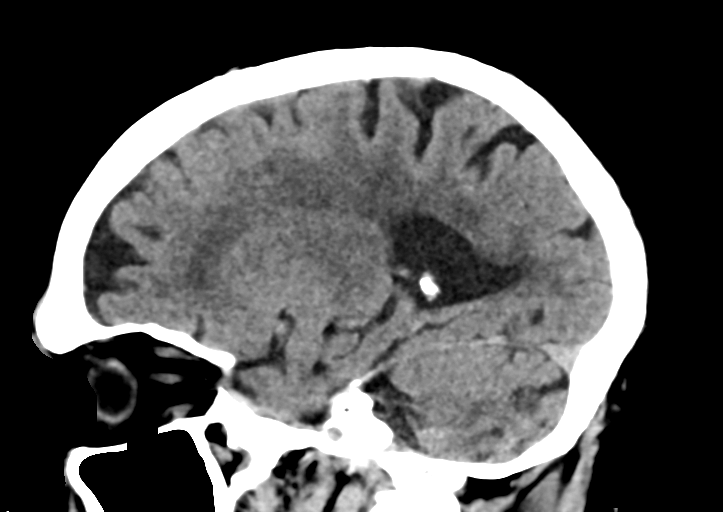
[im 28/55  brain]
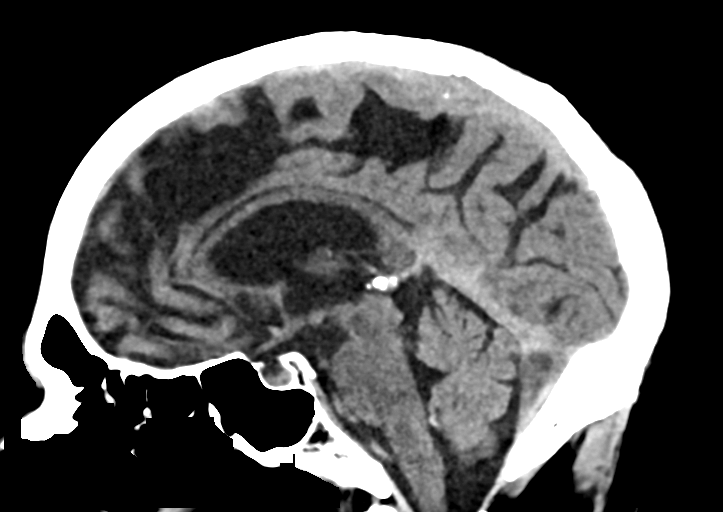
[im 37/55  brain]
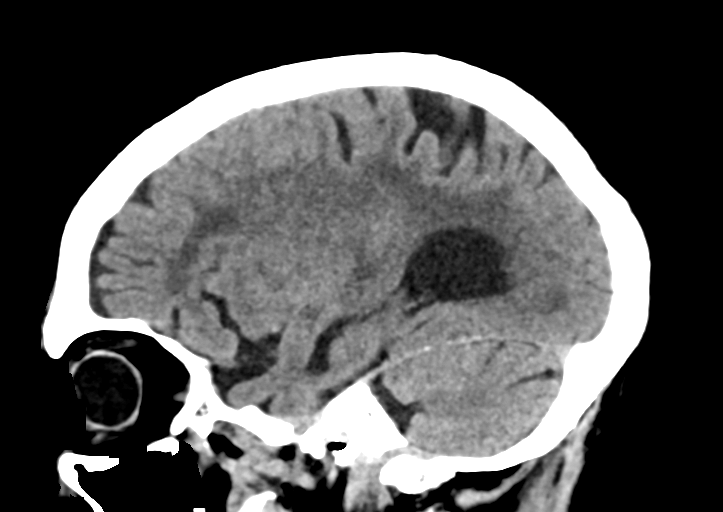

[16 of 47 positions shown; findings below may reference images not displayed]

FINDINGS: CT HEAD FINDINGS

Brain: No evidence of acute infarction, hemorrhage, hydrocephalus,
extra-axial collection or mass lesion/mass effect. Moderately
extensive patchy right cerebellar infarction since prior, but
chronic appearing. Cerebral volume loss and white matter
low-density.

Vascular: No hyperdense vessel or unexpected calcification.

Skull: Negative for fracture

Sinuses/Orbits: No evidence of injury

CT CERVICAL SPINE FINDINGS

Alignment: No traumatic malalignment

Skull base and vertebrae: No acute fracture. No primary bone lesion
or focal pathologic process.

Soft tissues and spinal canal: No prevertebral fluid or swelling. No
visible canal hematoma.

Disc levels: Generalized cervical disc narrowing and endplate
degeneration. Milder facet osteoarthritis with spurring.

Upper chest: Apical emphysema
IMPRESSION: 1. No evidence of acute intracranial or cervical spine injury.
2. Moderate right cerebellar infarction that is chronic appearing
but interval from a 0808 comparison.
